# Patient Record
Sex: Female | Born: 1960 | Race: White | Hispanic: No | Marital: Married | Smoking: Current every day smoker
Health system: Southern US, Community
[De-identification: ages and names within clinical notes are randomized; demographics above are authoritative.]

## PROBLEM LIST (undated history)

## (undated) DIAGNOSIS — M1990 Unspecified osteoarthritis, unspecified site: Secondary | ICD-10-CM

## (undated) DIAGNOSIS — L409 Psoriasis, unspecified: Secondary | ICD-10-CM

## (undated) DIAGNOSIS — K219 Gastro-esophageal reflux disease without esophagitis: Secondary | ICD-10-CM

## (undated) DIAGNOSIS — F329 Major depressive disorder, single episode, unspecified: Secondary | ICD-10-CM

## (undated) DIAGNOSIS — F319 Bipolar disorder, unspecified: Secondary | ICD-10-CM

## (undated) DIAGNOSIS — E785 Hyperlipidemia, unspecified: Secondary | ICD-10-CM

## (undated) DIAGNOSIS — G4730 Sleep apnea, unspecified: Secondary | ICD-10-CM

## (undated) DIAGNOSIS — F4310 Post-traumatic stress disorder, unspecified: Secondary | ICD-10-CM

## (undated) DIAGNOSIS — F1910 Other psychoactive substance abuse, uncomplicated: Secondary | ICD-10-CM

## (undated) DIAGNOSIS — F32A Depression, unspecified: Secondary | ICD-10-CM

## (undated) DIAGNOSIS — I10 Essential (primary) hypertension: Secondary | ICD-10-CM

## (undated) HISTORY — PX: AUGMENTATION MAMMAPLASTY: SUR837

## (undated) HISTORY — PX: BLADDER REPAIR: SHX76

## (undated) HISTORY — PX: RECTAL PROLAPSE REPAIR: SHX759

## (undated) HISTORY — PX: TONSILLECTOMY: SUR1361

## (undated) HISTORY — DX: Other psychoactive substance abuse, uncomplicated: F19.10

## (undated) HISTORY — DX: Sleep apnea, unspecified: G47.30

## (undated) HISTORY — PX: BLADDER SURGERY: SHX569

## (undated) HISTORY — DX: Hyperlipidemia, unspecified: E78.5

## (undated) HISTORY — DX: Post-traumatic stress disorder, unspecified: F43.10

## (undated) HISTORY — PX: ABDOMINAL HYSTERECTOMY: SHX81

## (undated) HISTORY — PX: BREAST ENHANCEMENT SURGERY: SHX7

## (undated) HISTORY — DX: Unspecified osteoarthritis, unspecified site: M19.90

## (undated) HISTORY — PX: REPAIR OF RECTAL PROLAPSE: SHX6420

---

## 1998-02-06 ENCOUNTER — Ambulatory Visit (HOSPITAL_COMMUNITY): Admission: RE | Admit: 1998-02-06 | Discharge: 1998-02-06 | Payer: Self-pay | Admitting: Family Medicine

## 1998-02-06 ENCOUNTER — Encounter: Payer: Self-pay | Admitting: Family Medicine

## 1999-02-12 ENCOUNTER — Encounter: Payer: Self-pay | Admitting: Family Medicine

## 1999-02-12 ENCOUNTER — Ambulatory Visit (HOSPITAL_COMMUNITY): Admission: RE | Admit: 1999-02-12 | Discharge: 1999-02-12 | Payer: Self-pay | Admitting: Family Medicine

## 1999-02-15 ENCOUNTER — Ambulatory Visit (HOSPITAL_COMMUNITY): Admission: RE | Admit: 1999-02-15 | Discharge: 1999-02-15 | Payer: Self-pay | Admitting: Family Medicine

## 1999-02-15 ENCOUNTER — Encounter: Payer: Self-pay | Admitting: Family Medicine

## 1999-03-26 ENCOUNTER — Other Ambulatory Visit: Admission: RE | Admit: 1999-03-26 | Discharge: 1999-03-26 | Payer: Self-pay | Admitting: Family Medicine

## 1999-04-09 ENCOUNTER — Ambulatory Visit (HOSPITAL_COMMUNITY): Admission: RE | Admit: 1999-04-09 | Discharge: 1999-04-09 | Payer: Self-pay | Admitting: Obstetrics and Gynecology

## 1999-04-09 ENCOUNTER — Encounter: Payer: Self-pay | Admitting: Obstetrics and Gynecology

## 1999-04-27 ENCOUNTER — Encounter (INDEPENDENT_AMBULATORY_CARE_PROVIDER_SITE_OTHER): Payer: Self-pay | Admitting: Specialist

## 1999-04-27 ENCOUNTER — Inpatient Hospital Stay (HOSPITAL_COMMUNITY): Admission: RE | Admit: 1999-04-27 | Discharge: 1999-05-01 | Payer: Self-pay | Admitting: Family Medicine

## 2001-01-10 ENCOUNTER — Emergency Department (HOSPITAL_COMMUNITY): Admission: EM | Admit: 2001-01-10 | Discharge: 2001-01-10 | Payer: Self-pay | Admitting: Emergency Medicine

## 2001-01-10 ENCOUNTER — Encounter: Payer: Self-pay | Admitting: Emergency Medicine

## 2001-05-10 ENCOUNTER — Encounter: Payer: Self-pay | Admitting: Obstetrics and Gynecology

## 2001-05-10 ENCOUNTER — Ambulatory Visit (HOSPITAL_COMMUNITY): Admission: RE | Admit: 2001-05-10 | Discharge: 2001-05-10 | Payer: Self-pay | Admitting: Obstetrics and Gynecology

## 2001-05-15 ENCOUNTER — Encounter: Admission: RE | Admit: 2001-05-15 | Discharge: 2001-05-15 | Payer: Self-pay | Admitting: Obstetrics and Gynecology

## 2001-05-15 ENCOUNTER — Encounter: Payer: Self-pay | Admitting: Obstetrics and Gynecology

## 2001-11-29 ENCOUNTER — Encounter: Payer: Self-pay | Admitting: Obstetrics and Gynecology

## 2001-11-29 ENCOUNTER — Ambulatory Visit (HOSPITAL_COMMUNITY): Admission: RE | Admit: 2001-11-29 | Discharge: 2001-11-29 | Payer: Self-pay | Admitting: Obstetrics and Gynecology

## 2002-11-07 ENCOUNTER — Encounter: Payer: Self-pay | Admitting: Obstetrics and Gynecology

## 2002-11-07 ENCOUNTER — Encounter: Admission: RE | Admit: 2002-11-07 | Discharge: 2002-11-07 | Payer: Self-pay | Admitting: Obstetrics and Gynecology

## 2003-03-05 ENCOUNTER — Other Ambulatory Visit: Admission: RE | Admit: 2003-03-05 | Discharge: 2003-03-05 | Payer: Self-pay | Admitting: Obstetrics and Gynecology

## 2003-04-27 ENCOUNTER — Emergency Department (HOSPITAL_COMMUNITY): Admission: EM | Admit: 2003-04-27 | Discharge: 2003-04-27 | Payer: Self-pay | Admitting: Emergency Medicine

## 2003-12-05 ENCOUNTER — Ambulatory Visit (HOSPITAL_COMMUNITY): Admission: RE | Admit: 2003-12-05 | Discharge: 2003-12-05 | Payer: Self-pay | Admitting: Obstetrics and Gynecology

## 2004-03-14 ENCOUNTER — Emergency Department (HOSPITAL_COMMUNITY): Admission: EM | Admit: 2004-03-14 | Discharge: 2004-03-14 | Payer: Self-pay | Admitting: Family Medicine

## 2004-10-10 ENCOUNTER — Emergency Department (HOSPITAL_COMMUNITY): Admission: EM | Admit: 2004-10-10 | Discharge: 2004-10-10 | Payer: Self-pay | Admitting: Emergency Medicine

## 2005-06-21 ENCOUNTER — Other Ambulatory Visit: Admission: RE | Admit: 2005-06-21 | Discharge: 2005-06-21 | Payer: Self-pay | Admitting: Obstetrics and Gynecology

## 2005-07-18 ENCOUNTER — Ambulatory Visit (HOSPITAL_COMMUNITY): Admission: RE | Admit: 2005-07-18 | Discharge: 2005-07-18 | Payer: Self-pay | Admitting: Obstetrics and Gynecology

## 2006-06-02 ENCOUNTER — Emergency Department (HOSPITAL_COMMUNITY): Admission: EM | Admit: 2006-06-02 | Discharge: 2006-06-02 | Payer: Self-pay | Admitting: Emergency Medicine

## 2006-06-07 ENCOUNTER — Encounter: Admission: RE | Admit: 2006-06-07 | Discharge: 2006-06-07 | Payer: Self-pay | Admitting: Family Medicine

## 2007-05-11 ENCOUNTER — Encounter: Admission: RE | Admit: 2007-05-11 | Discharge: 2007-05-11 | Payer: Self-pay | Admitting: Family Medicine

## 2008-03-18 ENCOUNTER — Emergency Department (HOSPITAL_COMMUNITY): Admission: EM | Admit: 2008-03-18 | Discharge: 2008-03-18 | Payer: Self-pay | Admitting: Emergency Medicine

## 2009-11-14 ENCOUNTER — Emergency Department (HOSPITAL_COMMUNITY): Admission: EM | Admit: 2009-11-14 | Discharge: 2009-11-14 | Payer: Self-pay | Admitting: Emergency Medicine

## 2010-05-27 LAB — URINALYSIS, ROUTINE W REFLEX MICROSCOPIC
Bilirubin Urine: NEGATIVE
Glucose, UA: NEGATIVE mg/dL
Ketones, ur: NEGATIVE mg/dL
Leukocytes, UA: NEGATIVE
Nitrite: NEGATIVE
Protein, ur: NEGATIVE mg/dL
Specific Gravity, Urine: 1.015 (ref 1.005–1.030)
Urobilinogen, UA: 0.2 mg/dL (ref 0.0–1.0)
pH: 6.5 (ref 5.0–8.0)

## 2010-05-27 LAB — URINE MICROSCOPIC-ADD ON

## 2010-06-28 LAB — COMPREHENSIVE METABOLIC PANEL
ALT: 12 U/L (ref 0–35)
Albumin: 3.6 g/dL (ref 3.5–5.2)
Alkaline Phosphatase: 75 U/L (ref 39–117)
GFR calc Af Amer: >60 mL/min (ref >60)
Potassium: 4.4 mEq/L (ref 3.5–5.1)
Sodium: 138 mEq/L (ref 135–145)
Total Bilirubin: 1.1 mg/dL (ref 0.3–1.2)
Total Protein: 6.7 g/dL (ref 6.0–8.3)

## 2010-06-28 LAB — URINE MICROSCOPIC-ADD ON

## 2010-06-28 LAB — CBC
HCT: 40.9 % (ref 36.0–46.0)
MCHC: 33.6 g/dL (ref 30.0–36.0)
RDW: 13.3 % (ref 11.5–15.5)
WBC: 8.1 10*3/uL (ref 4.0–10.5)

## 2010-06-28 LAB — URINALYSIS, ROUTINE W REFLEX MICROSCOPIC: Nitrite: NEGATIVE

## 2010-06-28 LAB — DIFFERENTIAL
Lymphocytes Relative: 17 % (ref 12–46)
Lymphs Abs: 1.4 10*3/uL (ref 0.7–4.0)
Monocytes Absolute: 0.6 10*3/uL (ref 0.1–1.0)
Monocytes Relative: 7 % (ref 3–12)
Neutro Abs: 6 10*3/uL (ref 1.7–7.7)
Neutrophils Relative %: 74 % (ref 43–77)

## 2010-07-30 NOTE — Discharge Summary (Signed)
[facility]  Patient:    Rebecca Espinoza, Rebecca Espinoza                       MRN: [id number] Adm. Date:  [id number] Disc. Date: [id number] Attending:  Miguel Aschoff                           Discharge Summary  ADMISSION DIAGNOSES:          1. Cystocele.                               2. Rectocele.                               3. Uterine descensus.                               4. Urinary stress incontinence.  FINAL DIAGNOSES:              1. Cystocele.                               2. Rectocele.                               3. Uterine descensus.                               4. Urinary stress incontinence.  OPERATIONS AND PROCEDURES:    1. Total vaginal hysterectomy.                               2. Bilateral salpingo-oophorectomy.                               3. Anterior and posterior colporrhaphy.  HISTORY OF PRESENT ILLNESS:   The patient is a 50 year old white female who has a history of pelvic pressure and pain and on examination was noted to have marked  relaxation of the pelvic floor with a cystourethrocele, large rectocele, and uterine descensus.  In addition, she had loss of the posterior ureterovesical angle and this was associated with urinary stress incontinence.  Because of these symptoms and a request for definitive repair, the patient was brought to the operating room to undergo total vaginal hysterectomy and anterior and posterior  colporrhaphy.  In addition, the patient had requested bilateral salpingo-oophorectomy be carried out at the time of this procedure.  LABORATORY DATA:              Preoperative studies were obtained.  This revealed an admission hemoglobin of 13.3 and a white count of 5100.  PT and PTT within normal limits.  Routine chemistry profile was also within normal limits.  The urinalysis was negative.  HOSPITAL COURSE:              Under general anesthesia, on April 27, 1999, the patient underwent a total vaginal  hysterectomy, bilateral salpingo-oophorectomy, and anterior and posterior colporrhaphy.  This was carried out without difficulty. At the time of  surgery, the posterior urethrovesical angle was reestablished. he patients postoperative course was essentially uncomplicated, although she initially had nausea and could not tolerate a regular diet.  However, by the fourth postoperative day, the patient was ambulating well and tolerating a regular diet. She, however, was unable to void spontaneously.  It was felt that she was in satisfactory condition at this point and she was discharged home on May 01, 1999.  DISCHARGE MEDICATIONS:        1. Tylox one every three hours as needed for pain.                               2. Premarin 0.625 mg one daily.                               3. Doxepin one twice a day.                               4. Macrobid one daily.  DIET:                         She was sent home on a regular diet.  ACTIVITIES:                   She was instructed in no heavy lifting and to place nothing in the vagina.  FOLLOW-UP:                    To call if there were any problems with fever, pain, or heavy bleeding.  She was also instructed to return to the office on May 06, 1999, after removing her Foley catheter to ensure that she was able to void spontaneously. DD:  05/21/99 TD:  05/22/99 Job: [id number]

## 2010-07-30 NOTE — Op Note (Signed)
[facility]  Patient:    Rebecca Espinoza, Rebecca Espinoza                       MRN: [id number] Proc. Date: 04/27/99 Adm. Date:  [id number] Attending:  Miguel Aschoff                           Operative Report  PREOPERATIVE DIAGNOSES:       Urinary stress incontinence, cystourethrocele, rectocele, uterine descensus.  POSTOPERATIVE DIAGNOSES:      Urinary stress incontinence, cystourethrocele, rectocele, uterine descensus.  PROCEDURE:                    Total vaginal hysterectomy, bilateral salpingo-oophorectomy, anterior and posterior colporrhaphy.  SURGEON:                      Miguel Aschoff, M.D.  ASSISTANT:                    Luvenia Redden, M.D.  ANESTHESIA:                   General.  COMPLICATIONS:                None.  JUSTIFICATION:                The patient is a 50 year old white female with history of urinary stress incontinence and pelvic pressure and pain.  On examination, she has an obvious cystourethrocele as well as uterine descensus and a small rectocele.  Because of the symptomatology associated with these pelvic floor defects, she presents now to undergo total vaginal hysterectomy and anterior and posterior colporrhaphy as well as urethral elevation.  In addition, the patient has requested that her ovaries be removed at this procedure if possible.  Informed consent has been obtained.  DESCRIPTION OF PROCEDURE:     Patient was taken to the operating room, placed in the supine position and general anesthesia was then administered without difficulty.  She was then placed in the dorsal lithotomy position and prepped and draped in the usual sterile fashion.  Bladder was then catheterized.  A weighted speculum was placed in the vaginal vault.  The anterior cervical lip was then grasped with a tenaculum and injected with 1% Xylocaine with epinephrine. After this was done, the mucosa was incised circumferentially and dissected anteriorly and  posteriorly until the peritoneal reflections could be found.  Peritoneum was then entered posteriorly.  Uterosacral ligaments were then identified, clamped, cut and suture-ligated using suture ligatures of 0 Vicryl.  The cardinal ligaments ere clamped, cut and suture-ligated in a similar fashion.  Similar bites were then taken of the paracervical fascia and these were clamped, cut and suture-ligated  using Heaney clamps and suture ligatures of 0 Vicryl.  At this point, the anterior peritoneum was entered and the dissection continued with the uterine vessels being identified, clamped, cut and suture-ligated using suture ligatures of 0 Vicryl.  Additional bites were taken of the broad ligament using curved Heaney clamps. ll pedicles were suture-ligated using suture ligatures of 0 Vicryl.  The uterine fundus was then delivered through the cul-de-sac and the remaining structures of the broad ligament, round ligament and uteroovarian ligament were then clamped ith Heaney clamps.  All pedicles were cut and suture-ligated using suture ligatures of 0 Vicryl.  At this point, the ovaries and tubes were identified.  On  the left side, they were grasped with a Babcock clamp, then a Heaney clamp was placed across the infundibulopelvic ligament, then the specimen of the ovary and tube on the left  side was excised and then a suture ligature was used about the infundibulopelvic ligament, followed by a free tie of 0 Vicryl.  An identical procedure was then carried out on the right side with the tube and ovary being grasped, the infundibulopelvic ligament being cut and doubly ligated using first a suture ligature and then a free tie of 0 Vicryl.  Inspection was made of all pedicles;  there was excellent hemostasis noted.  There appeared to be a small defect on the cul-de-sac and in an effort to prevent an enterocele, a 0 Vicryl suture was used to close the redundant cul-de-sac and a  pursestring suture was placed about the peritoneum and the peritoneum was closed; this was done with all lap counts and  instrument counts being correct.  A Foley catheter was then inserted.  The anterior vaginal wall was then injected again with 1% Xylocaine with epinephrine and dissection of the anterior wall mucosa was carried out to approximately 2 cm of the urethral orifice, then the paravesical fascia was dissected free from the overlying vaginal mucosa.  The urethrovesical angle was then identified and using a series of 2-0 Vicryl sutures, the urethrovesical angle elevated and then this was reinforced with a 0 Vicryl suture.  The cystocele was then reduced using pursestring suture of 2-0 Vicryl suture and then doubly reinforced with multiple 2-0 Vicryl sutures. The excess vaginal mucosa was trimmed and then the mucosa and dead space were closed using one interlocking 2-0 Vicryl suture.  The uterosacral ligaments were then approximated in the midline using interrupted 0 Vicryl sutures.  After this was  done, attention was directed to the posterior vaginal wall.  An ellipse of tissue of the perineum was then incised and elevated and the vaginal mucosa was grasped and this area was injected with 1% Xylocaine with epinephrine, then the mucosa as separated from the underlying pararectal fascia.  This dissection continued into the vagina for approximately 4 cm.  The rectocele was then closed using a pursestring suture of 2-0 Vicryl suture and then several interrupted sutures of 2-0 Vicryl.  The perineal muscles and levator muscles were approximated in the midline using interrupted 2-0 Vicryl sutures.  The excess mucosa was trimmed and then the vaginal mucosa was closed using one interlocking 2-0 Vicryl suture.  Perineum was closed using first a subcutaneous suture of 2-0 Vicryl suture, followed by a subcuticular suture of 2-0 Vicryl.  Final inspection was made for  hemostasis. There appeared to both good anterior and posterior support.  A vaginal pack of Iodoform gauze was placed and the procedure was completed.  The estimated blood  loss was approximately 200 cc.  Patient tolerated the procedure well. DD:  04/27/99 TD:  04/28/99 Job: [id number]

## 2010-09-22 DIAGNOSIS — Z113 Encounter for screening for infections with a predominantly sexual mode of transmission: Secondary | ICD-10-CM | POA: Insufficient documentation

## 2010-09-22 DIAGNOSIS — Z0000 Encounter for general adult medical examination without abnormal findings: Secondary | ICD-10-CM | POA: Insufficient documentation

## 2010-09-24 ENCOUNTER — Other Ambulatory Visit: Payer: Self-pay | Admitting: Family Medicine

## 2010-09-24 DIAGNOSIS — E049 Nontoxic goiter, unspecified: Secondary | ICD-10-CM

## 2010-09-27 ENCOUNTER — Ambulatory Visit
Admission: RE | Admit: 2010-09-27 | Discharge: 2010-09-27 | Disposition: A | Discharge status: Home / Self Care | Payer: BC Managed Care – PPO | Source: Ambulatory Visit | Attending: Family Medicine | Admitting: Family Medicine

## 2010-09-27 DIAGNOSIS — E049 Nontoxic goiter, unspecified: Secondary | ICD-10-CM

## 2010-10-07 ENCOUNTER — Other Ambulatory Visit (HOSPITAL_COMMUNITY)
Admission: RE | Admit: 2010-10-07 | Discharge: 2010-10-07 | Disposition: A | Discharge status: Home / Self Care | Payer: BC Managed Care – PPO | Source: Ambulatory Visit | Attending: Family Medicine | Admitting: Family Medicine

## 2010-12-25 ENCOUNTER — Emergency Department (HOSPITAL_COMMUNITY): Payer: BC Managed Care – PPO

## 2010-12-25 ENCOUNTER — Emergency Department (HOSPITAL_COMMUNITY)
Admission: EM | Admit: 2010-12-25 | Discharge: 2010-12-26 | Disposition: A | Discharge status: Home / Self Care | Payer: BC Managed Care – PPO | Attending: Emergency Medicine | Admitting: Emergency Medicine

## 2010-12-25 DIAGNOSIS — L4050 Arthropathic psoriasis, unspecified: Secondary | ICD-10-CM | POA: Insufficient documentation

## 2010-12-25 DIAGNOSIS — F3289 Other specified depressive episodes: Secondary | ICD-10-CM | POA: Insufficient documentation

## 2010-12-25 DIAGNOSIS — R1011 Right upper quadrant pain: Secondary | ICD-10-CM | POA: Insufficient documentation

## 2010-12-25 DIAGNOSIS — F329 Major depressive disorder, single episode, unspecified: Secondary | ICD-10-CM | POA: Insufficient documentation

## 2010-12-25 DIAGNOSIS — Z87442 Personal history of urinary calculi: Secondary | ICD-10-CM | POA: Insufficient documentation

## 2010-12-25 DIAGNOSIS — Z79899 Other long term (current) drug therapy: Secondary | ICD-10-CM | POA: Insufficient documentation

## 2010-12-25 LAB — DIFFERENTIAL
Basophils Absolute: 0 10*3/uL (ref 0.0–0.1)
Eosinophils Absolute: 0.1 10*3/uL (ref 0.0–0.7)
Eosinophils Relative: 1 % (ref 0–5)
Lymphocytes Relative: 39 % (ref 12–46)
Lymphs Abs: 2.1 10*3/uL (ref 0.7–4.0)
Monocytes Absolute: 0.6 10*3/uL (ref 0.1–1.0)
Monocytes Relative: 11 % (ref 3–12)
Neutro Abs: 2.7 10*3/uL (ref 1.7–7.7)
Neutrophils Relative %: 49 % (ref 43–77)

## 2010-12-25 LAB — CBC
HCT: 38.8 % (ref 36.0–46.0)
MCH: 29.7 pg (ref 26.0–34.0)
MCV: 85.5 fL (ref 78.0–100.0)
RBC: 4.54 MIL/uL (ref 3.87–5.11)
RDW: 12.8 % (ref 11.5–15.5)
WBC: 5.5 10*3/uL (ref 4.0–10.5)

## 2010-12-25 LAB — BASIC METABOLIC PANEL
BUN: 13 mg/dL (ref 6–23)
Calcium: 9.7 mg/dL (ref 8.4–10.5)
GFR calc Af Amer: >90 mL/min (ref >90)
GFR calc non Af Amer: >90 mL/min (ref >90)
Glucose, Bld: 117 mg/dL — ABNORMAL HIGH (ref 70–99)
Sodium: 141 mEq/L (ref 135–145)

## 2010-12-25 LAB — HEPATIC FUNCTION PANEL
Albumin: 4.1 g/dL (ref 3.5–5.2)
Alkaline Phosphatase: 141 U/L — ABNORMAL HIGH (ref 39–117)
Total Protein: 7.4 g/dL (ref 6.0–8.3)

## 2010-12-25 LAB — URINALYSIS, ROUTINE W REFLEX MICROSCOPIC
Bilirubin Urine: NEGATIVE
pH: 7 (ref 5.0–8.0)

## 2010-12-25 LAB — URINE MICROSCOPIC-ADD ON

## 2010-12-25 MED ORDER — IOHEXOL 300 MG/ML  SOLN
100.0000 mL | Freq: Once | INTRAMUSCULAR | Status: AC | PRN
  Administered 2010-12-25: 100 mL via INTRAVENOUS

## 2010-12-27 LAB — URINE CULTURE
Colony Count: NO GROWTH
Culture: NO GROWTH

## 2011-10-01 ENCOUNTER — Emergency Department (HOSPITAL_COMMUNITY)
Admission: EM | Admit: 2011-10-01 | Discharge: 2011-10-01 | Disposition: A | Discharge status: Home / Self Care | Payer: Self-pay | Attending: Emergency Medicine | Admitting: Emergency Medicine

## 2011-10-01 ENCOUNTER — Emergency Department (HOSPITAL_COMMUNITY): Payer: Self-pay

## 2011-10-01 ENCOUNTER — Encounter (HOSPITAL_COMMUNITY): Payer: Self-pay | Admitting: *Deleted

## 2011-10-01 DIAGNOSIS — K219 Gastro-esophageal reflux disease without esophagitis: Secondary | ICD-10-CM | POA: Insufficient documentation

## 2011-10-01 DIAGNOSIS — S63509A Unspecified sprain of unspecified wrist, initial encounter: Secondary | ICD-10-CM | POA: Insufficient documentation

## 2011-10-01 DIAGNOSIS — W07XXXA Fall from chair, initial encounter: Secondary | ICD-10-CM | POA: Insufficient documentation

## 2011-10-01 HISTORY — DX: Major depressive disorder, single episode, unspecified: F32.9

## 2011-10-01 HISTORY — DX: Gastro-esophageal reflux disease without esophagitis: K21.9

## 2011-10-01 HISTORY — DX: Depression, unspecified: F32.A

## 2011-10-01 MED ORDER — NAPROXEN 500 MG PO TABS: 500.0000 mg | ORAL_TABLET | Freq: Two times a day (BID) | ORAL | Status: DC

## 2011-10-01 MED ORDER — HYDROCODONE-ACETAMINOPHEN 5-325 MG PO TABS: 1.0000 | ORAL_TABLET | Freq: Four times a day (QID) | ORAL | Status: AC | PRN

## 2011-10-01 NOTE — Progress Notes (Signed)
Orthopedic Tech Progress Note Patient Details:  Rebecca Espinoza 06-Feb-1961 [id number]  Ortho Devices Type of Ortho Device: Velcro wrist splint Ortho Device/Splint Location: right wrist Ortho Device/Splint Interventions: Application   Nikki Dom 10/01/2011, 6:52 PM

## 2011-10-01 NOTE — ED Provider Notes (Signed)
History  Scribed for Shelda Jakes, MD, the patient was seen in room TR11C/TR11C. This chart was scribed by Candelaria Stagers. The patient's care started at 4:22 PM   CSN: [id number]  Arrival date & time 10/01/11  1533   First MD Initiated Contact with Patient 10/01/11 1614      Chief Complaint  Patient presents with  . Wrist Pain     The history is provided by the patient.   Rebecca Espinoza is a 51 y.o. female who presents to the Emergency Department complaining of right wrist pain after falling off a stool earlier today landing on her wrist.  She denies any other injury, LOC.  Nothing seems to make the pain better or worse.       Past Medical History  Diagnosis Date  . Acid reflux   . Depression     History reviewed. No pertinent past surgical history.  History reviewed. No pertinent family history.  History  Substance Use Topics  . Smoking status: Not on file  . Smokeless tobacco: Not on file  . Alcohol Use: No    OB History    Grav Para Term Preterm Abortions TAB SAB Ect Mult Living                  Review of Systems  HENT: Negative for neck pain.   Respiratory: Negative for shortness of breath.   Cardiovascular: Negative for chest pain.  Gastrointestinal: Negative for abdominal pain.  Musculoskeletal: Negative for back pain.  Skin: Negative for wound.  Neurological: Negative for numbness.  All other systems reviewed and are negative.    Allergies  Review of patient's allergies indicates no known allergies.  Home Medications   Current Outpatient Rx  Name Route Sig Dispense Refill  . HYDROCODONE-ACETAMINOPHEN 5-325 MG PO TABS Oral Take 1-2 tablets by mouth every 6 (six) hours as needed for pain. 10 tablet 0  . NAPROXEN 500 MG PO TABS Oral Take 1 tablet (500 mg total) by mouth 2 (two) times daily. 14 tablet 0    BP 143/90  Pulse 88  Resp 20  SpO2 100%  Physical Exam  Nursing note and vitals reviewed. Constitutional: She is oriented to  person, place, and time. She appears well-developed and well-nourished. No distress.  HENT:  Head: Normocephalic and atraumatic.  Eyes: EOM are normal. No scleral icterus.  Cardiovascular: Normal rate and regular rhythm.   No murmur heard. Pulmonary/Chest: She has no wheezes. She has no rales.  Abdominal: Bowel sounds are normal. There is no tenderness.  Musculoskeletal:       Radial pulse 1+.  Cap refill 1+.  Good movement of the right fingers.  Normal sensation of the right hand.    Neurological: She is alert and oriented to person, place, and time. No cranial nerve deficit.  Skin: Skin is warm and dry. She is not diaphoretic.  Psychiatric: She has a normal mood and affect. Her behavior is normal.    ED Course  Procedures   DIAGNOSTIC STUDIES: Oxygen Saturation is 100% on room air, normal by my interpretation.    COORDINATION OF CARE:     Labs Reviewed - No data to display Dg Wrist Complete Right  10/01/2011  *RADIOLOGY REPORT*  Clinical Data: Fall, pain  RIGHT WRIST - COMPLETE 3+ VIEW  Comparison: None.  Findings: Normal alignment without fracture.  Distal radius, ulna and carpal bones intact.  No radiographic swelling.  IMPRESSION: No acute osseous finding  Original Report Authenticated  By: M. Ruel Favors, M.D.     1. Wrist sprain       MDM   Fall with right wrist injury some snuffbox tenderness concern for an occult navicular injury or fracture. We'll treat with a Velcro wrist splint and followup with hand surgery.   I personally performed the services described in this documentation, which was scribed in my presence. The recorded information has been reviewed and considered.          Shelda Jakes, MD 10/01/11 1640

## 2011-10-01 NOTE — ED Notes (Signed)
Reports falling off a stool and trying to break her fall, now has right wrist pain, mild swelling noted, cms intact.

## 2011-11-17 ENCOUNTER — Other Ambulatory Visit: Payer: Self-pay | Admitting: Family Medicine

## 2011-11-17 ENCOUNTER — Ambulatory Visit
Admission: RE | Admit: 2011-11-17 | Discharge: 2011-11-17 | Disposition: A | Discharge status: Home / Self Care | Payer: No Typology Code available for payment source | Source: Ambulatory Visit | Attending: Family Medicine | Admitting: Family Medicine

## 2011-11-17 DIAGNOSIS — L409 Psoriasis, unspecified: Secondary | ICD-10-CM

## 2011-11-25 ENCOUNTER — Encounter (HOSPITAL_COMMUNITY): Payer: Self-pay | Admitting: Emergency Medicine

## 2011-11-25 ENCOUNTER — Emergency Department (INDEPENDENT_AMBULATORY_CARE_PROVIDER_SITE_OTHER)
Admission: EM | Admit: 2011-11-25 | Discharge: 2011-11-25 | Disposition: A | Discharge status: Home / Self Care | Payer: Self-pay | Source: Home / Self Care | Attending: Family Medicine | Admitting: Family Medicine

## 2011-11-25 DIAGNOSIS — I10 Essential (primary) hypertension: Secondary | ICD-10-CM

## 2011-11-25 HISTORY — DX: Psoriasis, unspecified: L40.9

## 2011-11-25 MED ORDER — LISINOPRIL-HYDROCHLOROTHIAZIDE 10-12.5 MG PO TABS: 1.0000 | ORAL_TABLET | Freq: Every day | ORAL | Status: DC

## 2011-11-25 NOTE — ED Notes (Signed)
Pt c/o elevated blood pressure since Monday of this week... She is on a "clinical trial" for psoriasis and was told on Monday that her BP was high... She's been checking it at her local [facility] and it's been running high.. No hx of elev. BP.... C/o headaches, chest discomfort, and feeling fatigued.... Denies: blurry vision, edema.

## 2011-11-25 NOTE — ED Provider Notes (Signed)
History     CSN: [id number]  Arrival date & time 11/25/11  4098   First MD Initiated Contact with Patient 11/25/11 1013      Chief Complaint  Patient presents with  . Hypertension    (Consider location/radiation/quality/duration/timing/severity/associated sxs/prior treatment) Patient is a 51 y.o. female presenting with hypertension. The history is provided by the patient.  Hypertension This is a chronic problem. The current episode started more than 1 week ago (told off and on that bp may have been elevated, is in financial difficulty, no md because could not pay bill, husband died 3 yr ago.). The problem has not changed since onset.Associated symptoms include headaches. Pertinent negatives include no shortness of breath.    Past Medical History  Diagnosis Date  . Acid reflux   . Depression   . Psoriasis   . Hypertension     Past Surgical History  Procedure Date  . Abdominal hysterectomy   . Rectal prolapse repair     No family history on file.  History  Substance Use Topics  . Smoking status: Not on file  . Smokeless tobacco: Not on file  . Alcohol Use: No    OB History    Grav Para Term Preterm Abortions TAB SAB Ect Mult Living                  Review of Systems  Constitutional: Negative.   Respiratory: Negative for chest tightness and shortness of breath.   Neurological: Positive for headaches. Negative for dizziness and numbness.    Allergies  Review of patient's allergies indicates no known allergies.  Home Medications   Current Outpatient Rx  Name Route Sig Dispense Refill  . INVESTIGATIONAL DRUG MIXTURE RECORD Subcutaneous Inject into the skin every 7 (seven) days. 1 prefilled syringe every Monday took first dose 11-21-11 Lxekizumab know as [id number] Protocol # I1F-MC-RHBL  Wirm [id number] By [employer] 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 LISINOPRIL-HYDROCHLOROTHIAZIDE 10-12.5 MG PO TABS Oral Take 1 tablet by mouth daily.      BP  176/109  Pulse 70  Temp 98.5 F (36.9 C) (Oral)  Resp 18  SpO2 99%  Physical Exam  Nursing note and vitals reviewed. Constitutional: She is oriented to person, place, and time. She appears well-developed and well-nourished.  HENT:  Head: Normocephalic.  Mouth/Throat: Oropharynx is clear and moist.  Eyes: Conjunctivae normal are normal. Pupils are equal, round, and reactive to light.  Neck: Normal range of motion. Neck supple.  Cardiovascular: Normal rate, normal heart sounds and intact distal pulses.   Pulmonary/Chest: Breath sounds normal.  Lymphadenopathy:    She has no cervical adenopathy.  Neurological: She is alert and oriented to person, place, and time.  Skin: Skin is warm and dry.    ED Course  Procedures (including critical care time)  Labs Reviewed - No data to display No results found.   1. Hypertension       MDM  Discussed with dr Scot Jun segren at [facility] re psoriasis study drug---no bp effect., they will f/up on mon with pt.        [name] Hoff, MD 11/26/11 [phone]

## 2011-11-26 ENCOUNTER — Encounter (HOSPITAL_COMMUNITY): Payer: Self-pay | Admitting: Family Medicine

## 2011-11-26 ENCOUNTER — Emergency Department (HOSPITAL_COMMUNITY)
Admission: EM | Admit: 2011-11-26 | Discharge: 2011-11-26 | Disposition: A | Discharge status: Home / Self Care | Payer: Self-pay | Attending: Emergency Medicine | Admitting: Emergency Medicine

## 2011-11-26 DIAGNOSIS — F329 Major depressive disorder, single episode, unspecified: Secondary | ICD-10-CM | POA: Insufficient documentation

## 2011-11-26 DIAGNOSIS — F3289 Other specified depressive episodes: Secondary | ICD-10-CM | POA: Insufficient documentation

## 2011-11-26 DIAGNOSIS — K219 Gastro-esophageal reflux disease without esophagitis: Secondary | ICD-10-CM | POA: Insufficient documentation

## 2011-11-26 DIAGNOSIS — F411 Generalized anxiety disorder: Secondary | ICD-10-CM | POA: Insufficient documentation

## 2011-11-26 DIAGNOSIS — L408 Other psoriasis: Secondary | ICD-10-CM | POA: Insufficient documentation

## 2011-11-26 DIAGNOSIS — I10 Essential (primary) hypertension: Secondary | ICD-10-CM | POA: Insufficient documentation

## 2011-11-26 HISTORY — DX: Essential (primary) hypertension: I10

## 2011-11-26 NOTE — ED Notes (Signed)
Per pt sts new onset HTN and was prescribed lisinopril. sts this am her BP was 190 sys. Currently BP is 158/97. Denies any symptoms just sts worried about increased BP.

## 2011-11-26 NOTE — ED Provider Notes (Signed)
History  Scribed for Ethelda Chick, MD, the patient was seen in room TR07C/TR07C. This chart was scribed by Candelaria Stagers. The patient's care started at 1:36 PM    CSN: [id number]  Arrival date & time 11/26/11  1102   None     Chief Complaint  Patient presents with  . Hypertension     The history is provided by the patient. No language interpreter was used.   Rebecca Espinoza is a 51 y.o. female who presents to the Emergency Department complaining of elevated blood pressure stating that today's reading was 190/100.  Pt has new onset of HTN and was prescribed lisinopril which she started today.  She has no other sx and is worried about the increasing BP readings.  She is experiencing anxiety regarding the BP readings.  Pt started a new medication for psoriasis six days ago which is part of an investigational study.  No chest pain, no shortness of breath.  No leg swelling.  There are no other associated systemic symptoms, there are no other alleviating or modifying factors.   Past Medical History  Diagnosis Date  . Acid reflux   . Depression   . Psoriasis   . Hypertension     Past Surgical History  Procedure Date  . Abdominal hysterectomy   . Rectal prolapse repair     History reviewed. No pertinent family history.  History  Substance Use Topics  . Smoking status: Not on file  . Smokeless tobacco: Not on file  . Alcohol Use: No    OB History    Grav Para Term Preterm Abortions TAB SAB Ect Mult Living                  Review of Systems  Constitutional:       Elevated blood pressure  Skin:       psoriasis  Psychiatric/Behavioral: The patient is nervous/anxious (regarding elevating BP).   All other systems reviewed and are negative.    Allergies  Review of patient's allergies indicates no known allergies.  Home Medications   Current Outpatient Rx  Name Route Sig Dispense Refill  . INVESTIGATIONAL DRUG MIXTURE RECORD Subcutaneous Inject into the skin  every 7 (seven) days. 1 prefilled syringe every Monday took first dose 11-21-11 Lxekizumab know as [id number] Protocol # I1F-MC-RHBL  Wirm [id number] By [employer] 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 LISINOPRIL-HYDROCHLOROTHIAZIDE 10-12.5 MG PO TABS Oral Take 1 tablet by mouth daily.      BP 147/89  Pulse 62  Temp 98.3 F (36.8 C) (Oral)  Resp 18  SpO2 100%  Physical Exam  Nursing note and vitals reviewed. Constitutional: She is oriented to person, place, and time. She appears well-developed and well-nourished. No distress.  HENT:  Head: Normocephalic and atraumatic.  Eyes: EOM are normal. Pupils are equal, round, and reactive to light.  Neck: Neck supple. No tracheal deviation present.  Cardiovascular: Normal rate.   Pulmonary/Chest: Effort normal. No respiratory distress.  Musculoskeletal: Normal range of motion. She exhibits no edema.  Neurological: She is alert and oriented to person, place, and time.  Skin: Skin is warm and dry.       Plaques with scale on her bilateral forearms c/o psoriasis.    Psychiatric: She has a normal mood and affect. Her behavior is normal.    ED Course  Procedures   DIAGNOSTIC STUDIES: Oxygen Saturation is 100% on room air, normal by my interpretation.  COORDINATION OF CARE:     Labs Reviewed - No data to display No results found.   1. Hypertension       MDM  Pt presents with c/o concern of hypertension.  She has felt symptoms of anxiety and has been concerned about elevated blood pressure reading.  She started lisinopril and took her first dose this morning.  Her blood pressure is mildly elevated in the ED.  No evidence of end organ damage.  Normal examination with the exception of psoriasis.    I personally performed the services described in this documentation, which was scribed in my presence. The recorded information has been reviewed and considered.         Ethelda Chick, MD 11/27/11 [phone]

## 2011-11-26 NOTE — Discharge Instructions (Signed)
Return to the ED with any concerns including weakness of extremities, chest pain, difficulty breathing, swelling of legs, fainting, decreased level of alertness/lethargy, or any other alarming symptoms

## 2014-07-12 IMAGING — CR DG WRIST COMPLETE 3+V*R*
4 series · 4 of 4 positions shown · non-contrast
Comparison: None.

CLINICAL DATA: Fall, pain

RIGHT WRIST - COMPLETE 3+ VIEW

[x wrist pa right]
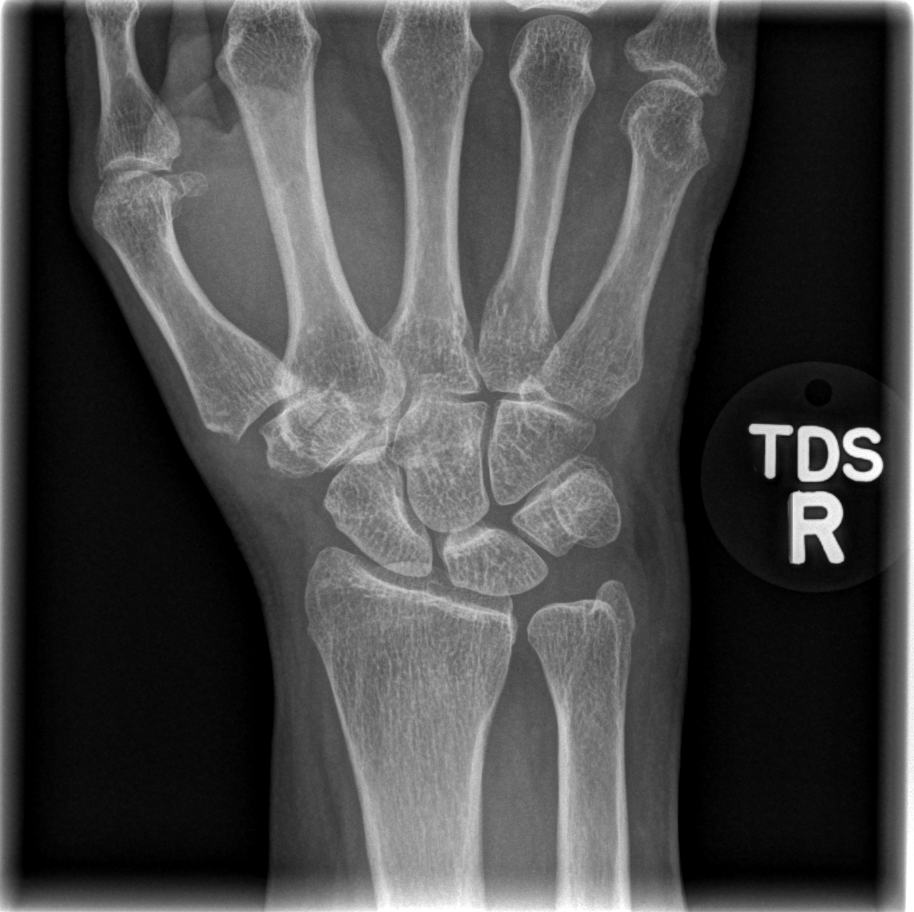

[x wrist obl right]
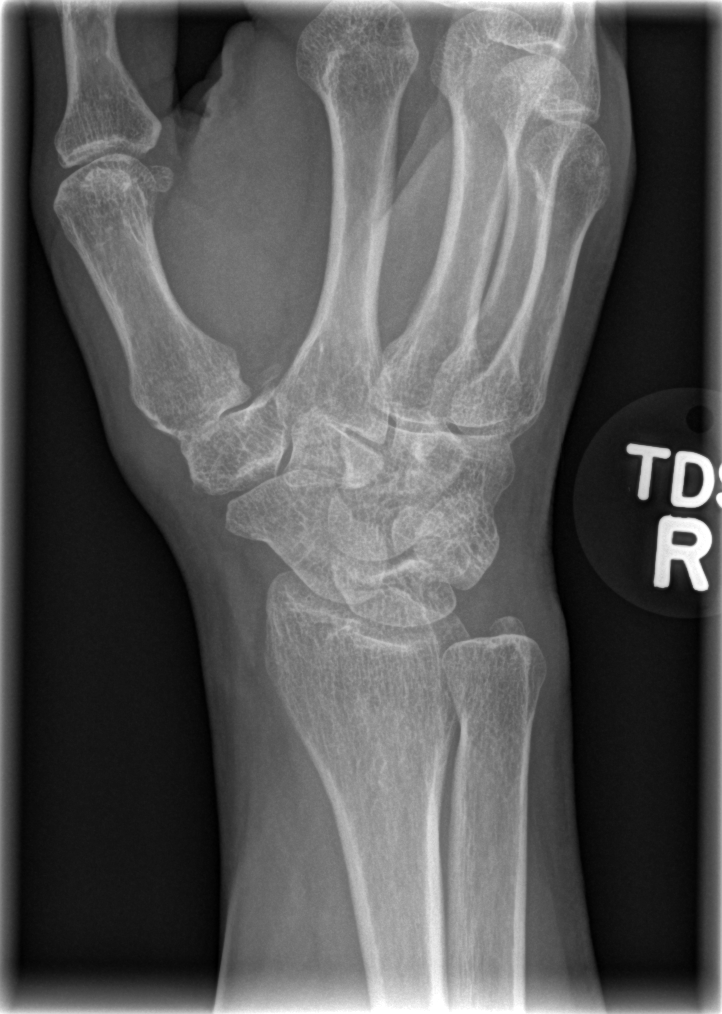

[x wrist lat right]
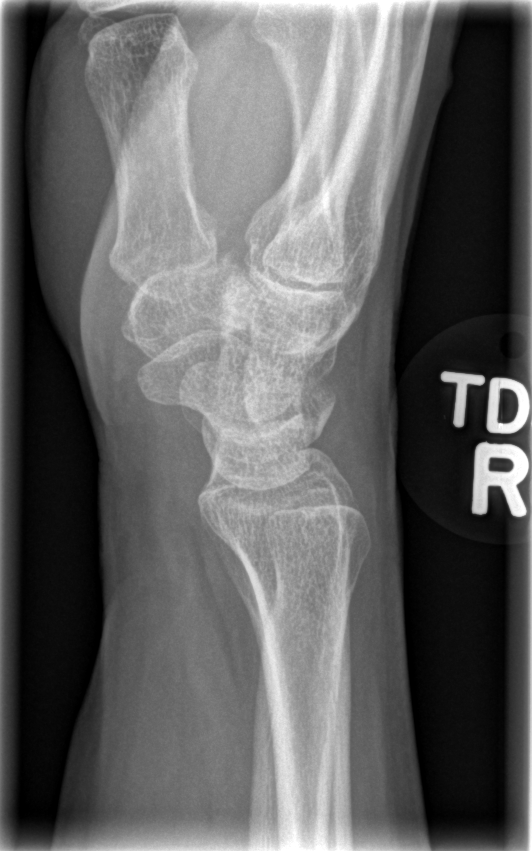

[x navicular]
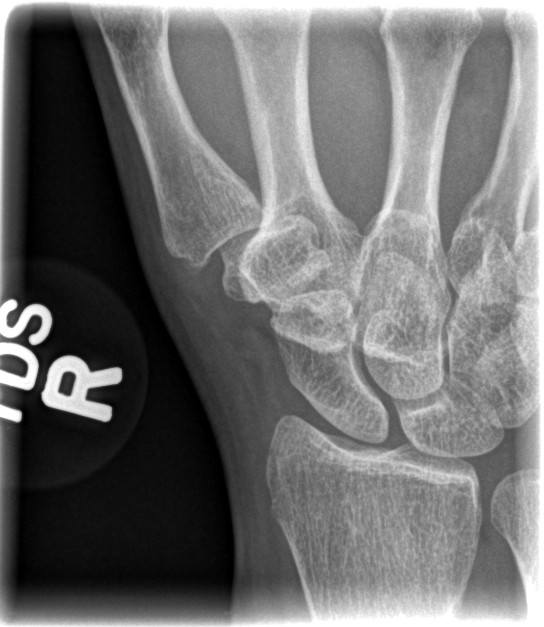

[4 of 4 positions shown; findings below may reference images not displayed]

FINDINGS: Normal alignment without fracture.  Distal radius, ulna
and carpal bones intact.  No radiographic swelling.
IMPRESSION: No acute osseous finding

## 2015-04-05 ENCOUNTER — Encounter (HOSPITAL_COMMUNITY): Payer: Self-pay | Admitting: *Deleted

## 2015-04-05 ENCOUNTER — Emergency Department (HOSPITAL_COMMUNITY): Payer: Self-pay

## 2015-04-05 ENCOUNTER — Emergency Department (HOSPITAL_BASED_OUTPATIENT_CLINIC_OR_DEPARTMENT_OTHER)
Admit: 2015-04-05 | Discharge: 2015-04-05 | Disposition: A | Discharge status: Home / Self Care | Payer: Self-pay | Attending: Emergency Medicine | Admitting: Emergency Medicine

## 2015-04-05 ENCOUNTER — Emergency Department (HOSPITAL_COMMUNITY)
Admission: EM | Admit: 2015-04-05 | Discharge: 2015-04-05 | Disposition: A | Discharge status: Home / Self Care | Payer: Medicaid Other | Attending: Emergency Medicine | Admitting: Emergency Medicine

## 2015-04-05 DIAGNOSIS — Z872 Personal history of diseases of the skin and subcutaneous tissue: Secondary | ICD-10-CM | POA: Insufficient documentation

## 2015-04-05 DIAGNOSIS — F17200 Nicotine dependence, unspecified, uncomplicated: Secondary | ICD-10-CM | POA: Insufficient documentation

## 2015-04-05 DIAGNOSIS — M79609 Pain in unspecified limb: Secondary | ICD-10-CM

## 2015-04-05 DIAGNOSIS — Z8719 Personal history of other diseases of the digestive system: Secondary | ICD-10-CM | POA: Insufficient documentation

## 2015-04-05 DIAGNOSIS — M7121 Synovial cyst of popliteal space [Baker], right knee: Secondary | ICD-10-CM | POA: Insufficient documentation

## 2015-04-05 DIAGNOSIS — M7989 Other specified soft tissue disorders: Secondary | ICD-10-CM

## 2015-04-05 DIAGNOSIS — I10 Essential (primary) hypertension: Secondary | ICD-10-CM | POA: Insufficient documentation

## 2015-04-05 DIAGNOSIS — Z8639 Personal history of other endocrine, nutritional and metabolic disease: Secondary | ICD-10-CM | POA: Insufficient documentation

## 2015-04-05 HISTORY — DX: Bipolar disorder, unspecified: F31.9

## 2015-04-05 NOTE — Progress Notes (Signed)
VASCULAR LAB PRELIMINARY  PRELIMINARY  PRELIMINARY  PRELIMINARY  Right lower extremity venous duplex completed.    Preliminary report:  No evidence of DVT or superficial thrombosis. There is an area of mixed echoes in the popliteal fossa measuring 3.75 cn by 1.73 cm longitudinal and 2.94 cm by 1.88 cm transverse consistent with a non ruptured Baker's cyst.  Rebecca Espinoza, RVS 04/05/2015, 12:31 PM

## 2015-04-05 NOTE — ED Provider Notes (Addendum)
CSN: [id number]     Arrival date & time 04/05/15  1015 History   First MD Initiated Contact with Patient 04/05/15 1108     Chief Complaint  Patient presents with  . Leg Pain     (Consider location/radiation/quality/duration/timing/severity/associated sxs/prior Treatment) HPI  55 year old female presents with swelling to the back of her right knee. Patient states that 4 days ago she was moving and lifting heavy boxes and going up and downstairs. Next couple days she had bilateral calf soreness and tenderness. However now she has noticed right popliteal swelling. Her left leg feels normal. Her No longer hurts on the right but she is concerned about a blood clot. She has also noticed some anterior knee swelling and some aching pain. She is not reversed specific injury or hitting her knee.  Patient also endorses trouble swallowing. This is been ongoing for over 4 months, she has not seen anybody about this. Several years ago, she thinks in 2010, she had an ultrasound of her neck that showed no large thyroid. She was never told that she had thyroid disease and is not on any medicines. She has been having trouble swallowing to the point that sometimes she has to lay with her neck completely hyperextended to swallow pills. She is able to eat and drink, but sometimes it is difficult. She states she doesn't have insurance and thus has not seen a PCP or any other provider about this. Is not particularly worse, but she wanted to bring it up. No neck pain or swelling.  Past Medical History  Diagnosis Date  . Acid reflux   . Depression   . Psoriasis   . Hypertension   . Bipolar 1 disorder [facility])    Past Surgical History  Procedure Laterality Date  . Abdominal hysterectomy    . Rectal prolapse repair     No family history on file. Social History  Substance Use Topics  . Smoking status: Current Every Day Smoker  . Smokeless tobacco: None  . Alcohol Use: Yes     Comment: socially   OB History    No data available     Review of Systems  HENT: Positive for trouble swallowing. Negative for sore throat.   Cardiovascular: Positive for leg swelling.  Musculoskeletal: Positive for arthralgias. Negative for neck pain.  Neurological: Negative for weakness and numbness.  All other systems reviewed and are negative.     Allergies  Review of patient's allergies indicates no known allergies.  Home Medications   Prior to Admission medications   Medication Sig Start Date End Date Taking? Authorizing Provider  dextrose 5 % SOLN 50 mL with INVESTIGATIONAL DRUG SIMPLE RECORD Inject into the skin every 7 (seven) days. 1 prefilled syringe every Monday took first dose 11-21-11 Lxekizumab know as [id number] Protocol # [id number]  Wirm [id number] By [place]. 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 11/25/11 11/24/12  Billy Fischer, MD   BP 156/95 mmHg  Pulse 90  Temp(Src) 98.4 F (36.9 C) (Oral)  Resp 20  SpO2 98% Physical Exam  Constitutional: She is oriented to person, place, and time. She appears well-developed and well-nourished.  HENT:  Head: Normocephalic and atraumatic.  Right Ear: External ear normal.  Left Ear: External ear normal.  Nose: Nose normal.  Mouth/Throat: Oropharynx is clear and moist.  Speaks in normal sentences  Eyes: Right eye exhibits no  discharge. Left eye exhibits no discharge.  Neck: Normal range of motion. Neck supple. No thyromegaly present.  No neck tenderness or swelling  Cardiovascular: Normal rate, regular rhythm and normal heart sounds.   Pulses:      Dorsalis pedis pulses are 2+ on the right side, and 2+ on the left side.  Pulmonary/Chest: Effort normal and breath sounds normal. No stridor. She has no wheezes.  Abdominal: She exhibits no distension.  Musculoskeletal:       Right knee: She exhibits swelling (mild,  inferior/anterior). Tenderness (mild) found.       Right lower leg: She exhibits no tenderness and no swelling.       Left lower leg: She exhibits no tenderness and no swelling.       Legs: Neurological: She is alert and oriented to person, place, and time.  Skin: Skin is warm and dry.  Nursing note and vitals reviewed.   ED Course  Procedures (including critical care time) Labs Review Labs Reviewed - No data to display  Imaging Review Dg Knee Complete 4 Views Right  04/05/2015  CLINICAL DATA:  Right posterior knee pain.  No history of injury. EXAM: RIGHT KNEE - COMPLETE 4+ VIEW COMPARISON:  None. FINDINGS: There is no evidence of fracture, dislocation, or joint effusion. There is no evidence of arthropathy or other focal bone abnormality. Soft tissues are unremarkable. IMPRESSION: Negative. Electronically Signed   By: Fidela Salisbury M.D.   On: 04/05/2015 11:57   VASCULAR LAB PRELIMINARY PRELIMINARY PRELIMINARY PRELIMINARY  Right lower extremity venous duplex completed.   Preliminary report: No evidence of DVT or superficial thrombosis. There is an area of mixed echoes in the popliteal fossa measuring 3.75 cn by 1.73 cm longitudinal and 2.94 cm by 1.88 cm transverse consistent with a non ruptured Baker's cyst.  SLAUGHTER, [place], RVS 04/05/2015, 12:31 PM  I have personally reviewed and evaluated these images and lab results as part of my medical decision-making.   EKG Interpretation None      MDM   Final diagnoses:  Baker's cyst, unruptured, right    No DVT on ultrasound. Most consistent with a Baker's cyst which correlates with exam. No signs of rupture. As for her swallowing difficulty this appears to be a chronic problem. She is not drooling, has no neck swelling, and no signs of acute airway distress. She appears quite well actually. Discussed importance of a PCP and she will likely need GI referral which she wants to get through her PCP. Of note, ultrasound in  2012 shows no thyromegaly.  Advised of return precautions.    Sherwood Gambler, MD 04/05/15 1624  Sherwood Gambler, MD 04/05/15 [phone]

## 2015-04-05 NOTE — ED Notes (Signed)
Pt reports noticing a "large lump" in back of RLE.  Worried about having a clot in her leg.  Denies any hx of DVT.  Pt reports she was moving Wednesday and was lifting boxes up and down the stairs.  Reports she woke up the next day with pain in bila LE and was massaging them to see if it helps the pain.

## 2015-04-05 NOTE — ED Notes (Signed)
Vascular US at bedside.

## 2015-06-30 ENCOUNTER — Emergency Department (HOSPITAL_COMMUNITY)
Admission: EM | Admit: 2015-06-30 | Discharge: 2015-06-30 | Disposition: A | Discharge status: Home / Self Care | Payer: Medicaid Other | Attending: Emergency Medicine | Admitting: Emergency Medicine

## 2015-06-30 ENCOUNTER — Emergency Department (HOSPITAL_COMMUNITY): Payer: Medicaid Other

## 2015-06-30 ENCOUNTER — Encounter (HOSPITAL_COMMUNITY): Payer: Self-pay

## 2015-06-30 DIAGNOSIS — Y9389 Activity, other specified: Secondary | ICD-10-CM | POA: Insufficient documentation

## 2015-06-30 DIAGNOSIS — F319 Bipolar disorder, unspecified: Secondary | ICD-10-CM | POA: Insufficient documentation

## 2015-06-30 DIAGNOSIS — IMO0001 Reserved for inherently not codable concepts without codable children: Secondary | ICD-10-CM

## 2015-06-30 DIAGNOSIS — Z79899 Other long term (current) drug therapy: Secondary | ICD-10-CM | POA: Insufficient documentation

## 2015-06-30 DIAGNOSIS — Y929 Unspecified place or not applicable: Secondary | ICD-10-CM | POA: Insufficient documentation

## 2015-06-30 DIAGNOSIS — S20212A Contusion of left front wall of thorax, initial encounter: Secondary | ICD-10-CM

## 2015-06-30 DIAGNOSIS — R030 Elevated blood-pressure reading, without diagnosis of hypertension: Secondary | ICD-10-CM

## 2015-06-30 DIAGNOSIS — X509XXA Other and unspecified overexertion or strenuous movements or postures, initial encounter: Secondary | ICD-10-CM | POA: Insufficient documentation

## 2015-06-30 DIAGNOSIS — I10 Essential (primary) hypertension: Secondary | ICD-10-CM | POA: Insufficient documentation

## 2015-06-30 DIAGNOSIS — F17200 Nicotine dependence, unspecified, uncomplicated: Secondary | ICD-10-CM | POA: Insufficient documentation

## 2015-06-30 DIAGNOSIS — F1210 Cannabis abuse, uncomplicated: Secondary | ICD-10-CM | POA: Insufficient documentation

## 2015-06-30 DIAGNOSIS — Y999 Unspecified external cause status: Secondary | ICD-10-CM | POA: Insufficient documentation

## 2015-06-30 NOTE — Progress Notes (Addendum)
ED CM spoke with pt about uninsured resources in [place] spoke with pt who confirms no pcp.  CM discussed and provided written information for uninsured accepting pcps, discussed the importance of pcp vs EDP services for f/u care, www.needymeds.org, www.goodrx.com, discounted pharmacies and other [employer] such as [employer], affordable care act, financial assistance, uninsured dental services, [place] med assist, DSS and  health department  Reviewed resources for [employer] uninsured accepting pcps like [facility], family medicine at [employer], [facility], [facility], local urgent care centers, [facility], [facility] family practice, general medical clinics, family services of the [place], [facility] urgent care plus others, medication resources, [facility] out patient pharmacies and housing Pt voiced understanding and appreciation of resources provided   Provided [facility] contact information pt is not eligible r/t having medicaid family planning Pt reports not having $50-75 co pays to go to discounted doctor or to pay [facility] bill Provided [facility] bill toll free number and discussed her speaking with a member of ED registration to find out possible means/payment plan of paying her chs bill  Entered in d/c instructions  Please use the resources provided to you in [place] [facility] ED to find a doctor for follow up doctor services [facility] resources will also assist with how to get assist with medication, financial resources, DSS, health dept, dental resources etc

## 2015-06-30 NOTE — ED Provider Notes (Signed)
CSN: [id number]     Arrival date & time 06/30/15  1422 History   First MD Initiated Contact with Patient 06/30/15 1505     Chief Complaint  Patient presents with  . Rib Injury     (Consider location/radiation/quality/duration/timing/severity/associated sxs/prior Treatment) The history is provided by the patient and medical records. No language interpreter was used.   Annalaura A Clyda Greener is a 55 y.o. female  with a PMH of HTN, bipolar who presents to the Emergency Department complaining of persistent left rib pain x 4 days. Patient states she was in bed and quickly looked over her shoulder (torso rotation) when she felt the acute onset of sharp pain in her left rib cage area. Patient states she has not tried any medications or remedies including ice/heat for symptoms. Pain was exacerbated yesterday when she attempted to vacuum. Pain also worse with deep breaths and coughing. Pain alleviated with rest, decreased movement. Patient denies abdominal pain, fever, shortness of breath/difficulty breathing.   Past Medical History  Diagnosis Date  . Acid reflux   . Depression   . Psoriasis   . Hypertension   . Bipolar 1 disorder [facility])    Past Surgical History  Procedure Laterality Date  . Abdominal hysterectomy    . Rectal prolapse repair     History reviewed. No pertinent family history. Social History  Substance Use Topics  . Smoking status: Current Every Day Smoker  . Smokeless tobacco: None  . Alcohol Use: Yes     Comment: socially   OB History    No data available     Review of Systems  Constitutional: Negative for fever.  HENT: Negative for congestion.   Eyes: Negative for visual disturbance.  Respiratory: Negative for shortness of breath.   Cardiovascular: Negative for chest pain.  Gastrointestinal: Negative for abdominal pain.  Genitourinary: Negative for dysuria.  Musculoskeletal: Positive for arthralgias (Rib pain).  Skin: Negative for color change.  Neurological:  Negative for syncope and weakness.      Allergies  Fish allergy  Home Medications   Prior to Admission medications   Medication Sig Start Date End Date Taking? Authorizing Provider  buPROPion (WELLBUTRIN SR) 100 MG 12 hr tablet Take 100 mg by mouth 2 (two) times daily.   Yes Historical Provider, MD  QUEtiapine (SEROQUEL) 100 MG tablet Take 100 mg by mouth at bedtime.   Yes Historical Provider, MD  venlafaxine XR (EFFEXOR-XR) 75 MG 24 hr capsule Take 75 mg by mouth 2 (two) times daily.    Yes Historical Provider, MD   BP 158/92 mmHg  Pulse 71  Temp(Src) 98.2 F (36.8 C) (Oral)  Resp 18  SpO2 100% Physical Exam  Constitutional: She is oriented to person, place, and time. She appears well-developed and well-nourished.  Alert and in no acute distress  HENT:  Head: Normocephalic and atraumatic.  Cardiovascular: Normal rate, regular rhythm and normal heart sounds.  Exam reveals no gallop and no friction rub.   No murmur heard. Pulmonary/Chest: Effort normal and breath sounds normal. No respiratory distress. She has no wheezes. She has no rales. She exhibits tenderness.    Point tenderness of left bottom rib as depicted in image. No overlying skin changes including erythema, ecchymosis, swelling, or deformity.  Abdominal: Soft. Bowel sounds are normal. She exhibits no distension and no mass. There is no tenderness. There is no rebound and no guarding.  Musculoskeletal: Normal range of motion.  Neurological: She is alert and oriented to person, place, and time.  Skin: Skin is warm and dry.  Nursing note and vitals reviewed.   ED Course  Procedures (including critical care time) Labs Review Labs Reviewed - No data to display  Imaging Review Dg Chest 2 View  06/30/2015  CLINICAL DATA:  Left lower rib pain since the patient pulled something in her chest 4 days ago when turning over in bed. Initial encounter. EXAM: CHEST  2 VIEW COMPARISON:  Single-view of the chest 11/17/2011.  FINDINGS: The lungs are clear. Heart size is normal. There is no pneumothorax or pleural effusion. No acute bony abnormality is identified. Thoracolumbar scoliosis is unchanged. IMPRESSION: No acute abnormality.  Stable compared to prior exam Electronically Signed   By: Inge Rise M.D.   On: 06/30/2015 14:52   I have personally reviewed and evaluated these images and lab results as part of my medical decision-making.   EKG Interpretation None      MDM   Final diagnoses:  Rib contusion, left, initial encounter  Elevated blood pressure   Amisha A Tolbert presents to ED for rib cage pain which began acutely after performing torso rotation. On exam, tenderness to left lower rib area with no overlying skin changes. Patient is 100% O2 on RA and denies difficulty breathing, however states pain is worse with deep breaths - will give incentive spirometer for home. CXR shows no bony abnormalities or acute cardiopulmonary disease. I'm care instructions given. Patient with mildly elevated blood pressure while in ED, encouraged follow-up with PCP in one week for a pressure recheck. Home care instructions were given, return precautions discussed, and all questions answered.    [facility] Amia Rynders, PA-C 06/30/15 1725  Julianne Rice, MD 06/30/15 Carollee Massed

## 2015-06-30 NOTE — ED Notes (Signed)
Discharge instructions and follow up care reviewed with patient. Patient verbalized understanding. 

## 2015-06-30 NOTE — ED Notes (Signed)
Pt states slept on Friday and went to turn and felt something in left rib.  Pain is increasing.  No cough.

## 2015-06-30 NOTE — ED Notes (Signed)
Patient transported to X-ray 

## 2015-06-30 NOTE — Discharge Instructions (Signed)
Use incentive spirometer multiple times throughout the day.  Ice affected area as directed above.  Tylenol as needed for pain - this is the pain reliever with the least reaction with your home medications.  Follow up with your primary care provider in one week for blood pressure check and to ensure healing of today's diagnosis.  Return to ER for new or worsening symptoms, any additional concerns.   COLD THERAPY DIRECTIONS:  Ice or gel packs can be used to reduce both pain and swelling. Ice is the most helpful within the first 24 to 48 hours after an injury or flareup from overusing a muscle or joint.  Ice is effective, has very few side effects, and is safe for most people to use.   If you expose your skin to cold temperatures for too long or without the proper protection, you can damage your skin or nerves. Watch for signs of skin damage due to cold.   HOME CARE INSTRUCTIONS  Follow these tips to use ice and cold packs safely.  Place a dry or damp towel between the ice and skin. A damp towel will cool the skin more quickly, so you may need to shorten the time that the ice is used.  For a more rapid response, add gentle compression to the ice.  Ice for no more than 10 to 20 minutes at a time. The bonier the area you are icing, the less time it will take to get the benefits of ice.  Check your skin after 5 minutes to make sure there are no signs of a poor response to cold or skin damage.  Rest 20 minutes or more in between uses.  Once your skin is numb, you can end your treatment. You can test numbness by very lightly touching your skin. The touch should be so light that you do not see the skin dimple from the pressure of your fingertip. When using ice, most people will feel these normal sensations in this order: cold, burning, aching, and numbness.

## 2015-06-30 NOTE — ED Notes (Signed)
ED PA at bedside

## 2015-07-09 LAB — TSH: TSH: 4.36 u[IU]/mL (ref 0.41–5.90)

## 2015-07-09 LAB — CBC AND DIFFERENTIAL
HCT: 42 % (ref 36–46)
Hemoglobin: 14 g/dL (ref 12.0–16.0)
Platelets: 193 10*3/uL (ref 150–399)
WBC: 5.3 10^3/mL

## 2015-07-09 LAB — HEPATIC FUNCTION PANEL
ALT: 10 U/L (ref 7–35)
AST: 15 U/L (ref 13–35)
Alkaline Phosphatase: 127 U/L — AB (ref 25–125)
Bilirubin, Total: 0.2 mg/dL

## 2015-07-09 LAB — C-REACTIVE PROTEIN: CRP: 0.3 mg/dL

## 2015-07-09 LAB — LIPID PANEL
CHOLESTEROL: 259 mg/dL — AB (ref 0–200)
HDL: 68 mg/dL (ref 35–70)
LDL Cholesterol: 134 mg/dL
LDl/HDL Ratio: 2
Triglycerides: 283 mg/dL — AB (ref 40–160)

## 2015-07-09 LAB — BASIC METABOLIC PANEL
BUN: 14 mg/dL (ref 4–21)
Creatinine: 0.9 mg/dL (ref 0.5–1.1)
GLUCOSE: 74 mg/dL
Potassium: 4.6 mmol/L (ref 3.4–5.3)
Sodium: 143 mmol/L (ref 137–147)

## 2015-07-09 LAB — VITAMIN D 25 HYDROXY (VIT D DEFICIENCY, FRACTURES): Vit D, 25-Hydroxy: 18.5

## 2015-11-26 DIAGNOSIS — E559 Vitamin D deficiency, unspecified: Secondary | ICD-10-CM | POA: Insufficient documentation

## 2015-11-26 DIAGNOSIS — M1990 Unspecified osteoarthritis, unspecified site: Secondary | ICD-10-CM | POA: Insufficient documentation

## 2015-11-26 DIAGNOSIS — Z803 Family history of malignant neoplasm of breast: Secondary | ICD-10-CM | POA: Insufficient documentation

## 2015-11-26 DIAGNOSIS — F430 Acute stress reaction: Secondary | ICD-10-CM | POA: Insufficient documentation

## 2015-11-26 DIAGNOSIS — M2550 Pain in unspecified joint: Secondary | ICD-10-CM | POA: Insufficient documentation

## 2015-11-26 DIAGNOSIS — F331 Major depressive disorder, recurrent, moderate: Secondary | ICD-10-CM | POA: Insufficient documentation

## 2015-11-30 ENCOUNTER — Ambulatory Visit (INDEPENDENT_AMBULATORY_CARE_PROVIDER_SITE_OTHER): Payer: Self-pay | Admitting: Family Medicine

## 2015-11-30 ENCOUNTER — Encounter: Payer: Self-pay | Admitting: Family Medicine

## 2015-11-30 VITALS — BP 125/74 | HR 78 | Ht 65.0 in | Wt 152.4 lb

## 2015-11-30 DIAGNOSIS — E559 Vitamin D deficiency, unspecified: Secondary | ICD-10-CM

## 2015-11-30 DIAGNOSIS — Z803 Family history of malignant neoplasm of breast: Secondary | ICD-10-CM

## 2015-11-30 DIAGNOSIS — Z9882 Breast implant status: Secondary | ICD-10-CM | POA: Insufficient documentation

## 2015-11-30 DIAGNOSIS — K219 Gastro-esophageal reflux disease without esophagitis: Secondary | ICD-10-CM | POA: Insufficient documentation

## 2015-11-30 DIAGNOSIS — L408 Other psoriasis: Secondary | ICD-10-CM

## 2015-11-30 DIAGNOSIS — F1290 Cannabis use, unspecified, uncomplicated: Secondary | ICD-10-CM

## 2015-11-30 DIAGNOSIS — E782 Mixed hyperlipidemia: Secondary | ICD-10-CM

## 2015-11-30 DIAGNOSIS — Z8742 Personal history of other diseases of the female genital tract: Secondary | ICD-10-CM

## 2015-11-30 DIAGNOSIS — F1220 Cannabis dependence, uncomplicated: Secondary | ICD-10-CM

## 2015-11-30 DIAGNOSIS — L308 Other specified dermatitis: Secondary | ICD-10-CM | POA: Insufficient documentation

## 2015-11-30 DIAGNOSIS — F3160 Bipolar disorder, current episode mixed, unspecified: Secondary | ICD-10-CM

## 2015-11-30 DIAGNOSIS — R413 Other amnesia: Secondary | ICD-10-CM | POA: Insufficient documentation

## 2015-11-30 DIAGNOSIS — I10 Essential (primary) hypertension: Secondary | ICD-10-CM

## 2015-11-30 DIAGNOSIS — Z87891 Personal history of nicotine dependence: Secondary | ICD-10-CM

## 2015-11-30 DIAGNOSIS — Z90710 Acquired absence of both cervix and uterus: Secondary | ICD-10-CM

## 2015-11-30 DIAGNOSIS — R5382 Chronic fatigue, unspecified: Secondary | ICD-10-CM

## 2015-11-30 NOTE — Progress Notes (Signed)
New patient office visit note:  Impression and Recommendations:    1. Hypertension, benign essential, goal below 140/90   2. Mixed hyperlipidemia   3. History of tobacco use disorder- quit 2/17   4. Family history of breast cancer in first degree relative   5. H/O abnormal cervical Papanicolaou smear   6. Avitaminosis D   7. Bipolar I disorder, most recent episode mixed ([place])   8. Gastroesophageal reflux disease, esophagitis presence not specified   9. Psoriasiform dermatitis   10. Chronic fatigue   11. Memory changes   12. Status post hysterectomy   13. Marijuana smoker, continuous ([place])    Bipolar I disorder, most recent episode mixed ([place]) Was going to [facility] in [place], but recently seen by [facility] in [place]- Dr Tye [place], went there 11th Sept.   Explained to patient in terms of her psychiatric complaints, she will need to address these with her psychiatrist.  Psoriasis B/l Lext.    Psoriasiform dermatitis B/l lext.- chronic- stable.   Family history of breast cancer in first degree relative MOm died age 66; dx age 53.  Last Mammo- was 05/12/14 at [place]  ----> pt will call for appt for reck As soon asw possible. She understands importance of this  H/O abnormal cervical Papanicolaou smear Has Medicaid for female care. Will make appointment for CPE.   Memory changes Ever since stress increased and went on psych meds. Getting W.    - Told patient she will need to address this with her psychiatrist.  Explained that I do not treat bipolar and I'm not familiar with all the medications she is on for this condition.  Avitaminosis D Patient was told in the past her vitamin D was low  Labs placed for near future.  Hypertension, benign essential, goal below 140/90 Stable. Well controlled. Cont meds.  We'll obtain blood work near future  Mixed hyperlipidemia Patient denies that she was ever on medications for this. We will check fasting lipid panel near  future  History of tobacco use disorder- quit 2/17 Patient understands with this history she is at increased risk for cancers, heart attack back/strokes and other various diseases  - Explained to patient in terms of her other multiple ongoing chronic issues/concerns, she will have to identify her 2 most important ones and make multiple follow-up office visits to discuss a couple at each office visit. Highly encouraged to do so in near future.  She will make a follow-up with her psychiatrist as well as follow-up for mammogram, and also told to make a follow-up for Pap smear etc.  Orders Placed This Encounter  Procedures  . CBC with Differential/Platelet  . COMPLETE METABOLIC PANEL WITH GFR  . Lipid panel  . Hemoglobin A1c  . T4, free  . TSH  . VITAMIN D 25 Hydroxy (Vit-D Deficiency, Fractures)  . Sedimentation rate  . RPR  . HIV antibody (with reflex)    New Prescriptions   No medications on file    Modified Medications   No medications on file    Discontinued Medications   No medications on file    Current Meds  Medication Sig  . buPROPion (WELLBUTRIN SR) 100 MG 12 hr tablet Take 100 mg by mouth 2 (two) times daily.  . hydrochlorothiazide (HYDRODIURIL) 25 MG tablet Take 25 mg by mouth daily.  Marland Kitchen lamoTRIgine (LAMICTAL) 25 MG tablet Take 50 mg by mouth at bedtime.  Marland Kitchen QUEtiapine (SEROQUEL) 100 MG tablet Take  100 mg by mouth at bedtime.  Marland Kitchen venlafaxine XR (EFFEXOR-XR) 75 MG 24 hr capsule Take 75 mg by mouth daily.     The patient was counseled, risk factors were discussed, anticipatory guidance given.  Gross side effects, risk and benefits, and alternatives of medications discussed with patient.  Patient is aware that all medications have potential side effects and we are unable to predict every side effect or drug-drug interaction that may occur.  Expresses verbal understanding and consents to current therapy plan and treatment regimen.  Return for f/up fasting bloodwrok near  future and then f/up OV to discuss results. .  Please see AVS handed out to patient at the end of our visit for further patient instructions/ counseling done pertaining to today's office visit.    Note: This document was prepared using Dragon voice recognition software and may include unintentional dictation errors.  ----------------------------------------------------------------------------------------------------------------------    Subjective:    Chief Complaint  Patient presents with  . Establish Care    HPI: Rebecca Espinoza is a pleasant 55 y.o. female who presents to [place] at [facility] today to review their medical history with me and establish care.     She also would like to discuss, if there is time, her "weak bones", cholesterol levels b/c of worry about having heart attack/stroke, anxiety, depression, possible memory loss, changes in birthmarks, "bad shoulder due to Baker's cyst", chronic back pain, clicking and creaking in her neck and also blood pressure refills.   Not employed currently:  due to bi-polar.  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   Single, divorced- 4 kids and 6 grandchildren.    Bad relationship w Ex-husband.   HTN- since 2013;   L rotator cuff injury- difficult to raise arm etc. Never been evaluated.    L breast lump- had [place]- last eval was over a year ago- 2015      Wt Readings from Last 3 Encounters:  11/30/15 152 lb 6.4 oz (69.1 kg)   BP Readings from Last 3 Encounters:  11/30/15 125/74  06/30/15 158/92  04/05/15 152/90   Pulse Readings from Last 3 Encounters:  11/30/15 78  06/30/15 71  04/05/15 70   BMI Readings from Last 3 Encounters:  11/30/15 25.36 kg/m    Patient Active Problem List   Diagnosis Date Noted  . Mixed hyperlipidemia 11/30/2015    Priority: High  . Hypertension, benign essential, goal below 140/90 11/30/2015    Priority: High  . History of tobacco use disorder- quit 2/17 12/06/2015     Priority: Medium  . H/O abnormal cervical Papanicolaou smear 11/30/2015    Priority: Medium  . Family history of breast cancer in first degree relative 11/26/2015    Priority: Medium  . Status post hysterectomy 12/06/2015  . Bipolar I disorder, most recent episode mixed ([place]) 11/30/2015  . GERD (gastroesophageal reflux disease) 11/30/2015  . Psoriasiform dermatitis 11/30/2015  . History of breast augmentation 11/30/2015  . Chronic fatigue 11/30/2015  . Memory changes 11/30/2015  . Marijuana smoker, continuous ([place]) 11/30/2015  . Ache in joint 11/26/2015  . Gen Arthritis 11/26/2015  . Moderate episode of recurrent major depressive disorder ([place]) 11/26/2015  . Acute stress disorder 11/26/2015  . Avitaminosis D 11/26/2015     Past Medical History:  Diagnosis Date  . Acid reflux   . Bipolar 1 disorder ([place])   . Depression   . Hyperlipidemia   . Hypertension   . Psoriasis  Past Medical History:  Diagnosis Date  . Acid reflux   . Bipolar 1 disorder ([place])   . Depression   . Hyperlipidemia   . Hypertension   . Psoriasis      Past Surgical History:  Procedure Laterality Date  . ABDOMINAL HYSTERECTOMY    . BREAST ENHANCEMENT SURGERY    . RECTAL PROLAPSE REPAIR    . TONSILLECTOMY       Family History  Problem Relation Age of Onset  . Cancer Mother     breast, age 61  . Alcohol abuse Father   . Depression Father      History  Drug Use  . Types: Marijuana    History  Alcohol Use  . Yes    Comment: socially    History  Smoking Status  . Former Smoker  . Quit date: 04/15/2015  Smokeless Tobacco  . Never Used     Patient's Medications  New Prescriptions   No medications on file  Previous Medications   BUPROPION (WELLBUTRIN SR) 100 MG 12 HR TABLET    Take 100 mg by mouth 2 (two) times daily.   HYDROCHLOROTHIAZIDE (HYDRODIURIL) 25 MG TABLET    Take 25 mg by mouth daily.   LAMOTRIGINE (LAMICTAL) 25 MG TABLET    Take 50 mg by mouth at bedtime.    QUETIAPINE (SEROQUEL) 100 MG TABLET    Take 100 mg by mouth at bedtime.   VENLAFAXINE XR (EFFEXOR-XR) 75 MG 24 HR CAPSULE    Take 75 mg by mouth daily.   Modified Medications   No medications on file  Discontinued Medications   No medications on file    Allergies: Fish allergy  Review of Systems  Constitutional: Negative.   HENT: Negative.   Eyes: Negative.   Respiratory: Negative.   Cardiovascular: Negative.   Gastrointestinal: Negative.   Genitourinary: Negative.   Musculoskeletal: Positive for back pain, joint pain and myalgias. Negative for falls.  Skin: Positive for rash.       Psoriasis-chronic  Neurological: Negative.        Memory loss  Endo/Heme/Allergies: Negative.   Psychiatric/Behavioral: Positive for depression and memory loss. Negative for hallucinations and suicidal ideas. The patient is nervous/anxious.        Anxiety, bipolar-     Objective:    Blood pressure 125/74, pulse 78, height 5\' 5"  (1.651 m), weight 152 lb 6.4 oz (69.1 kg). Body mass index is 25.36 kg/m. General: Well Developed, well nourished, and in no acute distress.  Neuro: Alert and oriented x3, extra-ocular muscles intact, sensation grossly intact.  HEENT: Normocephalic, atraumatic, pupils equal round reactive to light, neck supple, no gross masses, no carotid bruits, no JVD apprec Skin: no gross suspicious lesions or rashes  Cardiac: Regular rate and rhythm, no murmurs rubs or gallops.  Respiratory: Essentially clear to auscultation bilaterally. Not using accessory muscles, speaking in full sentences.  Abdominal: Soft, not grossly distended Musculoskeletal: Ambulates w/o diff, FROM * 4 ext.  Vasc: less 2 sec cap RF, warm and pink  Psych:  No HI/SI, judgement and insight good, Euthymic mood. Full Affect.

## 2015-11-30 NOTE — Assessment & Plan Note (Signed)
Has Medicaid for female care. Will make appointment for CPE.

## 2015-11-30 NOTE — Assessment & Plan Note (Addendum)
Was going to [facility] in [place], but recently seen by [facility] in [place]- Dr Tye [place], went there 11th Sept.   Explained to patient in terms of her psychiatric complaints, she will need to address these with her psychiatrist.

## 2015-11-30 NOTE — Patient Instructions (Addendum)
Calcium; Vitamin D chewable tablet What is this medicine? CALCIUM; VITAMIN D (KAL see um; VYE ta min D) is a vitamin supplement. It is used to prevent conditions of low calcium and vitamin D. This medicine may be used for other purposes; ask your health care provider or pharmacist if you have questions. What should I tell my health care provider before I take this medicine? They need to know if you have any of these conditions: -constipation -dehydration -heart disease -high level of calcium or vitamin D in the blood -high level of phosphate in the blood -kidney disease -kidney stones -liver disease -parathyroid disease -sarcoidosis -stomach ulcer or obstruction -an unusual or allergic reaction to calcium, vitamin D, tartrazine dye, other medicines, foods, dyes, or preservatives -pregnant or trying to get pregnant -breast-feeding How should I use this medicine? Take this medicine by mouth. Chew it completely before swallowing. Follow the directions on the prescription label. Take with food or within 1 hour after a meal. Take your medicine at regular intervals. Do not take your medicine more often than directed. Talk to your pediatrician regarding the use of this medicine in children. While this medicine may be used in children for selected conditions, precautions do apply. Overdosage: If you think you have taken too much of this medicine contact a poison control center or emergency room at once. NOTE: This medicine is only for you. Do not share this medicine with others. What if I miss a dose? If you miss a dose, take it as soon as you can. If it is almost time for your next dose, take only that dose. Do not take double or extra doses. What may interact with this medicine? Do not take this medicine with any of the following medications: -ammonium chloride -methenamine This medicine may also interact with the following medications: -antibiotics like ciprofloxacin, gatifloxacin,  tetracycline -captopril -delavirdine -diuretics -gabapentin -iron supplements -medicines for fungal infections like ketoconazole and itraconazole -medicines for seizures like ethotoin and phenytoin -mineral oil -mycophenolate -other vitamins with calcium, vitamin D, or minerals -quinidine -rosuvastatin -sucralfate -thyroid medicine This list may not describe all possible interactions. Give your health care provider a list of all the medicines, herbs, non-prescription drugs, or dietary supplements you use. Also tell them if you smoke, drink alcohol, or use illegal drugs. Some items may interact with your medicine. What should I watch for while using this medicine? Taking this medicine is not a substitute for a well-balanced diet and exercise. Talk with your doctor or health care provider and follow a healthy lifestyle. Do not take this medicine with high-fiber foods, large amounts of alcohol, or drinks containing caffeine. Do not take this medicine within 2 hours of any other medicines. What side effects may I notice from receiving this medicine? Side effects that you should report to your doctor or health care professional as soon as possible: -allergic reactions like skin rash, itching or hives, swelling of the face, lips, or tongue -confusion -dry mouth -high blood pressure -increased hunger or thirst -increased urination -irregular heartbeat -metallic taste -muscle or bone pain -pain when urinating -seizure -unusually weak or tired -weight loss Side effects that usually do not require medical attention (report to your doctor or health care professional if they continue or are bothersome): -constipation -diarrhea -headache -loss of appetite -nausea, vomiting -stomach upset This list may not describe all possible side effects. Call your doctor for medical advice about side effects. You may report side effects to FDA at 1-800-FDA-1088.  Where should I keep my medicine? Keep  out of the reach of children. Store at room temperature between 15 and 30 degrees C (59 and 86 degrees F). Protect from light. Keep container tightly closed. Throw away any unused medicine after the expiration date. NOTE: This sheet is a summary. It may not cover all possible information. If you have questions about this medicine, talk to your doctor, pharmacist, or health care provider.    2016, Elsevier/Gold Standard. (2007-06-13 17:57:27)       Guidelines for a Low Cholesterol, Low Saturated Fat Diet   Fats - Limit total intake of fats and oils. - Avoid butter, stick margarine, shortening, lard, palm and coconut oils. - Limit mayonnaise, salad dressings, gravies and sauces, unless they are homemade with low-fat ingredients. - Limit chocolate. - Choose low-fat and nonfat products, such as low-fat mayonnaise, low-fat or non-hydrogenated peanut butter, low-fat or fat-free salad dressings and nonfat gravy. - Use vegetable oil, such as canola or olive oil. - Look for margarine that does not contain trans fatty acids. - Use nuts in moderate amounts. - Read ingredient labels carefully to determine both amount and type of fat present in foods. Limit saturated and trans fats! - Avoid high-fat processed and convenience foods.  Meats and Meat Alternatives - Choose fish, chicken, [place] and lean meats. - Use dried beans, peas, lentils and tofu. - Limit egg yolks to three to four per week. - If you eat red meat, limit to no more than three servings per week and choose loin or round cuts. - Avoid fatty meats, such as bacon, sausage, franks, luncheon meats and ribs. - Avoid all organ meats, including liver.  Dairy - Choose nonfat or low-fat milk, yogurt and cottage cheese. - Most cheeses are high in fat. Choose cheeses made from non-fat milk, such as mozzarella and ricotta cheese. - Choose light or fat-free cream cheese and sour cream. - Avoid cream and sauces made with cream.  Fruits  and Vegetables - Eat a wide variety of fruits and vegetables. - Use lemon juice, vinegar or "mist" olive oil on vegetables. - Avoid adding sauces, fat or oil to vegetables.  Breads, Cereals and Grains - Choose whole-grain breads, cereals, pastas and rice. - Avoid high-fat snack foods, such as granola, cookies, pies, pastries, doughnuts and croissants.  Cooking Tips - Avoid deep fried foods. - Trim visible fat off meats and remove skin from poultry before cooking. - Bake, broil, boil, poach or roast poultry, fish and lean meats. - Drain and discard fat that drains out of meat as you cook it. - Add little or no fat to foods. - Use vegetable oil sprays to grease pans for cooking or baking. - Steam vegetables. - Use herbs or no-oil marinades to flavor foods.

## 2015-11-30 NOTE — Assessment & Plan Note (Addendum)
Ever since stress increased and went on psych meds. Getting W.    - Told patient she will need to address this with her psychiatrist.  Explained that I do not treat bipolar and I'm not familiar with all the medications she is on for this condition.

## 2015-11-30 NOTE — Assessment & Plan Note (Addendum)
B/l lext.- chronic- stable.

## 2015-11-30 NOTE — Assessment & Plan Note (Signed)
B/l Lext.

## 2015-11-30 NOTE — Assessment & Plan Note (Addendum)
MOm died age 55; dx age 76.  Last Mammo- was 05/12/14 at [place]  ----> pt will call for appt for reck As soon asw possible. She understands importance of this

## 2015-12-06 DIAGNOSIS — Z90710 Acquired absence of both cervix and uterus: Secondary | ICD-10-CM | POA: Insufficient documentation

## 2015-12-06 DIAGNOSIS — Z87891 Personal history of nicotine dependence: Secondary | ICD-10-CM | POA: Insufficient documentation

## 2015-12-06 NOTE — Assessment & Plan Note (Signed)
Patient was told in the past her vitamin D was low  Labs placed for near future.

## 2015-12-06 NOTE — Assessment & Plan Note (Addendum)
Stable. Well controlled. Cont meds.  We'll obtain blood work near future

## 2015-12-06 NOTE — Assessment & Plan Note (Signed)
Patient understands with this history she is at increased risk for cancers, heart attack back/strokes and other various diseases

## 2015-12-06 NOTE — Assessment & Plan Note (Signed)
Patient denies that she was ever on medications for this. We will check fasting lipid panel near future

## 2015-12-08 ENCOUNTER — Other Ambulatory Visit (HOSPITAL_COMMUNITY): Payer: Self-pay | Admitting: *Deleted

## 2015-12-08 DIAGNOSIS — N644 Mastodynia: Secondary | ICD-10-CM

## 2015-12-08 DIAGNOSIS — N6320 Unspecified lump in the left breast, unspecified quadrant: Secondary | ICD-10-CM

## 2015-12-09 ENCOUNTER — Encounter: Payer: Self-pay | Admitting: Family Medicine

## 2015-12-09 ENCOUNTER — Other Ambulatory Visit (INDEPENDENT_AMBULATORY_CARE_PROVIDER_SITE_OTHER): Payer: Self-pay

## 2015-12-09 DIAGNOSIS — E559 Vitamin D deficiency, unspecified: Secondary | ICD-10-CM

## 2015-12-09 DIAGNOSIS — F3160 Bipolar disorder, current episode mixed, unspecified: Secondary | ICD-10-CM

## 2015-12-09 DIAGNOSIS — L308 Other specified dermatitis: Secondary | ICD-10-CM

## 2015-12-09 DIAGNOSIS — R413 Other amnesia: Secondary | ICD-10-CM

## 2015-12-09 DIAGNOSIS — Z803 Family history of malignant neoplasm of breast: Secondary | ICD-10-CM

## 2015-12-09 DIAGNOSIS — K219 Gastro-esophageal reflux disease without esophagitis: Secondary | ICD-10-CM

## 2015-12-09 DIAGNOSIS — I10 Essential (primary) hypertension: Secondary | ICD-10-CM

## 2015-12-09 DIAGNOSIS — R5382 Chronic fatigue, unspecified: Secondary | ICD-10-CM

## 2015-12-09 DIAGNOSIS — L408 Other psoriasis: Secondary | ICD-10-CM

## 2015-12-09 DIAGNOSIS — E782 Mixed hyperlipidemia: Secondary | ICD-10-CM

## 2015-12-09 DIAGNOSIS — Z8742 Personal history of other diseases of the female genital tract: Secondary | ICD-10-CM

## 2015-12-10 LAB — CBC WITH DIFFERENTIAL/PLATELET
BASOS PCT: 0 %
Basophils Absolute: 0 cells/uL (ref 0–200)
EOS ABS: 110 {cells}/uL (ref 15–500)
EOS PCT: 2 %
HCT: 41.3 % (ref 35.0–45.0)
Hemoglobin: 13.8 g/dL (ref 11.7–15.5)
Lymphocytes Relative: 25 %
Lymphs Abs: 1375 cells/uL (ref 850–3900)
MCH: 29.5 pg (ref 27.0–33.0)
MCHC: 33.4 g/dL (ref 32.0–36.0)
MCV: 88.2 fL (ref 80.0–100.0)
MONOS PCT: 9 %
MPV: 10.1 fL (ref 7.5–12.5)
Monocytes Absolute: 495 cells/uL (ref 200–950)
NEUTROS ABS: 3520 {cells}/uL (ref 1500–7800)
Neutrophils Relative %: 64 %
PLATELETS: 164 10*3/uL (ref 140–400)
RBC: 4.68 MIL/uL (ref 3.80–5.10)
RDW: 14 % (ref 11.0–15.0)
WBC: 5.5 10*3/uL (ref 3.8–10.8)

## 2015-12-10 LAB — LIPID PANEL
CHOL/HDL RATIO: 3.6 ratio (ref <=5.0)
CHOLESTEROL: 257 mg/dL — AB (ref 125–200)
HDL: 71 mg/dL (ref >=46)
LDL Cholesterol: 161 mg/dL — ABNORMAL HIGH (ref <130)
Triglycerides: 124 mg/dL (ref <150)
VLDL: 25 mg/dL (ref <30)

## 2015-12-10 LAB — COMPLETE METABOLIC PANEL WITH GFR
ALT: 10 U/L (ref 6–29)
AST: 18 U/L (ref 10–35)
Albumin: 4.1 g/dL (ref 3.6–5.1)
Alkaline Phosphatase: 90 U/L (ref 33–130)
BILIRUBIN TOTAL: 0.6 mg/dL (ref 0.2–1.2)
BUN: 17 mg/dL (ref 7–25)
CO2: 29 mmol/L (ref 20–31)
CREATININE: 1 mg/dL (ref 0.50–1.05)
Calcium: 8.9 mg/dL (ref 8.6–10.4)
Chloride: 101 mmol/L (ref 98–110)
GFR, Est African American: 73 mL/min (ref >=60)
GFR, Est Non African American: 63 mL/min (ref >=60)
GLUCOSE: 89 mg/dL (ref 65–99)
Potassium: 3.3 mmol/L — ABNORMAL LOW (ref 3.5–5.3)
SODIUM: 142 mmol/L (ref 135–146)
TOTAL PROTEIN: 6.6 g/dL (ref 6.1–8.1)

## 2015-12-10 LAB — SEDIMENTATION RATE: SED RATE: 7 mm/h (ref 0–30)

## 2015-12-10 LAB — HEMOGLOBIN A1C
Hgb A1c MFr Bld: 5 % (ref <5.7)
Mean Plasma Glucose: 97 mg/dL

## 2015-12-10 LAB — TSH: TSH: 6.92 mIU/L — ABNORMAL HIGH

## 2015-12-10 LAB — T4, FREE: Free T4: 0.9 ng/dL (ref 0.8–1.8)

## 2015-12-10 LAB — RPR

## 2015-12-10 LAB — VITAMIN D 25 HYDROXY (VIT D DEFICIENCY, FRACTURES): VIT D 25 HYDROXY: 22 ng/mL — AB (ref 30–100)

## 2015-12-11 MED ORDER — TRIAMCINOLONE ACETONIDE 0.1 % EX CREA: 1.0000 "application " | TOPICAL_CREAM | Freq: Two times a day (BID) | CUTANEOUS | 0 refills | Status: DC

## 2015-12-11 NOTE — Addendum Note (Signed)
Addended by: Mellody Dance on: 12/11/2015 08:11 AM   Modules accepted: Orders

## 2015-12-15 ENCOUNTER — Other Ambulatory Visit: Payer: Self-pay

## 2015-12-15 DIAGNOSIS — R7989 Other specified abnormal findings of blood chemistry: Secondary | ICD-10-CM

## 2015-12-16 ENCOUNTER — Other Ambulatory Visit (INDEPENDENT_AMBULATORY_CARE_PROVIDER_SITE_OTHER): Payer: Self-pay

## 2015-12-16 DIAGNOSIS — R946 Abnormal results of thyroid function studies: Secondary | ICD-10-CM

## 2015-12-16 DIAGNOSIS — R7989 Other specified abnormal findings of blood chemistry: Secondary | ICD-10-CM

## 2015-12-17 ENCOUNTER — Ambulatory Visit (INDEPENDENT_AMBULATORY_CARE_PROVIDER_SITE_OTHER): Payer: Self-pay | Admitting: Family Medicine

## 2015-12-17 ENCOUNTER — Encounter: Payer: Self-pay | Admitting: Family Medicine

## 2015-12-17 VITALS — BP 131/82 | HR 80 | Ht 65.0 in | Wt 151.3 lb

## 2015-12-17 DIAGNOSIS — E038 Other specified hypothyroidism: Secondary | ICD-10-CM

## 2015-12-17 DIAGNOSIS — E876 Hypokalemia: Secondary | ICD-10-CM

## 2015-12-17 DIAGNOSIS — E039 Hypothyroidism, unspecified: Secondary | ICD-10-CM

## 2015-12-17 DIAGNOSIS — E7800 Pure hypercholesterolemia, unspecified: Secondary | ICD-10-CM

## 2015-12-17 DIAGNOSIS — I10 Essential (primary) hypertension: Secondary | ICD-10-CM

## 2015-12-17 DIAGNOSIS — E782 Mixed hyperlipidemia: Secondary | ICD-10-CM

## 2015-12-17 DIAGNOSIS — Z87891 Personal history of nicotine dependence: Secondary | ICD-10-CM

## 2015-12-17 DIAGNOSIS — E559 Vitamin D deficiency, unspecified: Secondary | ICD-10-CM

## 2015-12-17 LAB — T3, FREE: T3 FREE: 3 pg/mL (ref 2.3–4.2)

## 2015-12-17 NOTE — Patient Instructions (Addendum)
You can look up potassium rich foods and just try to eat more of those foods  Also you are not drinking enough water. You should be taking at least 80 ounces of water per day if you are not sweating or outside for any prolonged period of time.  Please take in at least 1200 mg of calcium per day to help with prevention of osteoporosis. Yes also since her vitamin D is so low please take 5000 IUs daily. You need to do this in order to help with fatigue, muscle fatigue and also strong bones  Reck-  TSH, T3 and Free T4, FLP, K level, vit D in about 4-28months.     Please realize, EXERCISE IS MEDICINE!  -  American Heart Association [facility]) guidelines for exercise : If you are in good health, without any medical conditions, you should engage in 150 minutes of moderate intensity aerobic activity per week.  This means you should be huffing and puffing throughout your workout.   Engaging in regular exercise will improve brain function and memory, as well as improve mood, boost immune system and help with weight management.  As well as the other, more well-known effects of exercise such as decreasing blood sugar levels, decreasing blood pressure,  and decreasing bad cholesterol levels/ increasing good cholesterol levels.     -  The AHA strongly endorses consumption of a diet that contains a variety of foods from all the food categories with an emphasis on fruits and vegetables; fat-free and low-fat dairy products; cereal and grain products; legumes and nuts; and fish, poultry, and/or extra lean meats.    Excessive food intake, especially of foods high in saturated and trans fats, sugar, and salt, should be avoided.    Adequate water intake of roughly 1/2 of your weight in pounds, should equal the ounces of water per day you should drink.  So for instance, if you're 200 pounds, that would be 100 ounces of water per day.         Mediterranean Diet  Why follow it? Research shows. . Those who follow the  Mediterranean diet have a reduced risk of heart disease  . The diet is associated with a reduced incidence of Parkinson's and Alzheimer's diseases . People following the diet may have longer life expectancies and lower rates of chronic diseases  . The Dietary Guidelines for Americans recommends the Mediterranean diet as an eating plan to promote health and prevent disease  What Is the Mediterranean Diet?  . Healthy eating plan based on typical foods and recipes of Mediterranean-style cooking . The diet is primarily a plant based diet; these foods should make up a majority of meals   Starches - Plant based foods should make up a majority of meals - They are an important sources of vitamins, minerals, energy, antioxidants, and fiber - Choose whole grains, foods high in fiber and minimally processed items  - Typical grain sources include wheat, oats, barley, corn, brown rice, bulgar, farro, millet, polenta, couscous  - Various types of beans include chickpeas, lentils, fava beans, black beans, white beans   Fruits  Veggies - Large quantities of antioxidant rich fruits & veggies; 6 or more servings  - Vegetables can be eaten raw or lightly drizzled with oil and cooked  - Vegetables common to the traditional Mediterranean Diet include: artichokes, arugula, beets, broccoli, brussel sprouts, cabbage, carrots, celery, collard greens, cucumbers, eggplant, kale, leeks, lemons, lettuce, mushrooms, okra, onions, peas, peppers, potatoes, pumpkin, radishes, rutabaga, shallots,  spinach, sweet potatoes, turnips, zucchini - Fruits common to the Mediterranean Diet include: apples, apricots, avocados, cherries, clementines, dates, figs, grapefruits, grapes, melons, nectarines, oranges, peaches, pears, pomegranates, strawberries, tangerines  Fats - Replace butter and margarine with healthy oils, such as olive oil, canola oil, and tahini  - Limit nuts to no more than a handful a day  - Nuts include walnuts, almonds,  pecans, pistachios, pine nuts  - Limit or avoid candied, honey roasted or heavily salted nuts - Olives are central to the [employer] - can be eaten whole or used in a variety of dishes   Meats Protein - Limiting red meat: no more than a few times a month - When eating red meat: choose lean cuts and keep the portion to the size of deck of cards - Eggs: approx. 0 to 4 times a week  - Fish and lean poultry: at least 2 a week  - Healthy protein sources include, chicken, [place], lean beef, lamb - Increase intake of seafood such as tuna, salmon, trout, mackerel, shrimp, scallops - Avoid or limit high fat processed meats such as sausage and bacon  Dairy - Include moderate amounts of low fat dairy products  - Focus on healthy dairy such as fat free yogurt, skim milk, low or reduced fat cheese - Limit dairy products higher in fat such as whole or 2% milk, cheese, ice cream  Alcohol - Moderate amounts of red wine is ok  - No more than 5 oz daily for women (all ages) and men older than age 58  - No more than 10 oz of wine daily for men younger than 81  Other - Limit sweets and other desserts  - Use herbs and spices instead of salt to flavor foods  - Herbs and spices common to the traditional Mediterranean Diet include: basil, bay leaves, chives, cloves, cumin, fennel, garlic, lavender, marjoram, mint, oregano, parsley, pepper, rosemary, sage, savory, sumac, tarragon, thyme   It's not just a diet, it's a lifestyle:  . The Mediterranean diet includes lifestyle factors typical of those in the region  . Foods, drinks and meals are best eaten with others and savored . Daily physical activity is important for overall good health . This could be strenuous exercise like running and aerobics . This could also be more leisurely activities such as walking, housework, yard-work, or taking the stairs . Moderation is the key; a balanced and healthy diet accommodates most foods and drinks . Consider portion  sizes and frequency of consumption of certain foods   Meal Ideas & Options:  . Breakfast:  o Whole wheat toast or whole wheat English muffins with peanut butter & hard boiled egg o Steel cut oats topped with apples & cinnamon and skim milk  o Fresh fruit: banana, strawberries, melon, berries, peaches  o Smoothies: strawberries, bananas, greek yogurt, peanut butter o Low fat greek yogurt with blueberries and granola  o Egg white omelet with spinach and mushrooms o Breakfast couscous: whole wheat couscous, apricots, skim milk, cranberries  . Sandwiches:  o Hummus and grilled vegetables (peppers, zucchini, squash) on whole wheat bread   o Grilled chicken on whole wheat pita with lettuce, tomatoes, cucumbers or tzatziki  o Tuna salad on whole wheat bread: tuna salad made with greek yogurt, olives, red peppers, capers, green onions o Garlic rosemary lamb pita: lamb sauted with garlic, rosemary, salt & pepper; add lettuce, cucumber, greek yogurt to pita - flavor with lemon juice and black pepper  .  Seafood:  o Mediterranean grilled salmon, seasoned with garlic, basil, parsley, lemon juice and black pepper o Shrimp, lemon, and spinach whole-grain pasta salad made with low fat greek yogurt  o Seared scallops with lemon orzo  o Seared tuna steaks seasoned salt, pepper, coriander topped with tomato mixture of olives, tomatoes, olive oil, minced garlic, parsley, green onions and cappers  . Meats:  o Herbed greek chicken salad with kalamata olives, cucumber, feta  o Red bell peppers stuffed with spinach, bulgur, lean ground beef (or lentils) & topped with feta   o Kebabs: skewers of chicken, tomatoes, onions, zucchini, squash  o [place] burgers: made with red onions, mint, dill, lemon juice, feta cheese topped with roasted red peppers . Vegetarian o Cucumber salad: cucumbers, artichoke hearts, celery, red onion, feta cheese, tossed in olive oil & lemon juice  o Hummus and whole grain pita points with  a greek salad (lettuce, tomato, feta, olives, cucumbers, red onion) o Lentil soup with celery, carrots made with vegetable broth, garlic, salt and pepper  o Tabouli salad: parsley, bulgur, mint, scallions, cucumbers, tomato, radishes, lemon juice, olive oil, salt and pepper.     Guidelines for a Low Cholesterol, Low Saturated Fat Diet   Fats - Limit total intake of fats and oils. - Avoid butter, stick margarine, shortening, lard, palm and coconut oils. - Limit mayonnaise, salad dressings, gravies and sauces, unless they are homemade with low-fat ingredients. - Limit chocolate. - Choose low-fat and nonfat products, such as low-fat mayonnaise, low-fat or non-hydrogenated peanut butter, low-fat or fat-free salad dressings and nonfat gravy. - Use vegetable oil, such as canola or olive oil. - Look for margarine that does not contain trans fatty acids. - Use nuts in moderate amounts. - Read ingredient labels carefully to determine both amount and type of fat present in foods. Limit saturated and trans fats! - Avoid high-fat processed and convenience foods.  Meats and Meat Alternatives - Choose fish, chicken, [place] and lean meats. - Use dried beans, peas, lentils and tofu. - Limit egg yolks to three to four per week. - If you eat red meat, limit to no more than three servings per week and choose loin or round cuts. - Avoid fatty meats, such as bacon, sausage, franks, luncheon meats and ribs. - Avoid all organ meats, including liver.  Dairy - Choose nonfat or low-fat milk, yogurt and cottage cheese. - Most cheeses are high in fat. Choose cheeses made from non-fat milk, such as mozzarella and ricotta cheese. - Choose light or fat-free cream cheese and sour cream. - Avoid cream and sauces made with cream.  Fruits and Vegetables - Eat a wide variety of fruits and vegetables. - Use lemon juice, vinegar or "mist" olive oil on vegetables. - Avoid adding sauces, fat or oil to  vegetables.  Breads, Cereals and Grains - Choose whole-grain breads, cereals, pastas and rice. - Avoid high-fat snack foods, such as granola, cookies, pies, pastries, doughnuts and croissants.  Cooking Tips - Avoid deep fried foods. - Trim visible fat off meats and remove skin from poultry before cooking. - Bake, broil, boil, poach or roast poultry, fish and lean meats. - Drain and discard fat that drains out of meat as you cook it. - Add little or no fat to foods. - Use vegetable oil sprays to grease pans for cooking or baking. - Steam vegetables. - Use herbs or no-oil marinades to flavor foods.    For those diagnosed with high triglycerides, it's important to  take action to lower your levels and improve your heart health.  Triglyceride is just a fancy word for fat - the fat in our bodies is stored in the form of triglycerides. Triglycerides are found in foods and manufactured in our bodies.  Normal triglyceride levels are defined as less than 150 mg/dL; 150 to 199 is considered borderline high; 200 to 499 is high; and 500 or higher is officially called very high. To me, anything over 150 is a red flag indicating my patient needs to take immediate steps to get the situation under control.   What is the significance of high triglycerides? High triglyceride levels make blood thicker and stickier, which means that it is more likely to form clots. Studies have shown that triglyceride levels are associated with increased risks of cardiovascular disease and stroke - in both men and women - alone or in combination with other risk factors (high triglycerides combined with high LDL cholesterol can be a particularly deadly combination). For example, in one ground-breaking study, high triglycerides alone increased the risk of cardiovascular disease by 14 percent in men, and by 62 percent in women. But when the test subjects also had low HDL cholesterol (that's the good cholesterol) and other risk  factors, high triglycerides increased the risk of disease by 32 percent in men and 76 percent in women.   Fortunately, triglycerides can sometimes be controlled with several diet and lifestyle changes.    What Factors Can Increase Triglycerides? As with cholesterol, eating too much of the wrong kinds of fats will raise your blood triglycerides.  Therefore, it's important to restrict the amounts of saturated fats and trans fats you allow into your diet.  Triglyceride levels can also shoot up after eating foods that are high in carbohydrates or after drinking alcohol.  That's why triglyceride blood tests require an overnight fast.  If you have elevated triglycerides, it's especially important to avoid sugary and refined carbohydrates, including sugar, honey, and other sweeteners, soda and other sugary drinks, candy, baked goods, and anything made with white (refined or enriched) flour, including white bread, rolls, cereals, buns, pastries, regular pasta, and white rice.  You'll also want to limit dried fruit and fruit juice since they're dense in simple sugar.  All of these low-quality carbs cause a sudden rise in insulin, which may lead to a spike in triglycerides.  Triglycerides can also become elevated as a reaction to having diabetes, hypothyroidism, or kidney disease. As with most other heart-related factors, being overweight and inactive also contribute to abnormal triglycerides. And unfortunately, some people have a genetic predisposition that causes them to manufacture way too much triglycerides on their own, no matter how carefully they eat.   How Can You Lower Your Triglyceride Levels? If you are diagnosed with high triglycerides, it's important to take action. There are several things you can do to help lower your triglyceride levels and improve your heart health:  Lose weight if you are overweight.  There is a clear correlation between obesity and high triglycerides - the heavier people are,  the higher their triglyceride levels are likely to be. The good news is that losing weight can significantly lower triglycerides. In a large study of individuals with type 2 diabetes, those assigned to the "lifestyle intervention group" - which involved counseling, a low-calorie meal plan, and customized exercise program - lost 8.6% of their body weight and lowered their triglyceride levels by more than 16%. If you're overweight, find a weight loss plan that works  for you and commit to shedding the pounds and getting healthier.  Reduce the amount of saturated fat and trans fat in your diet.  Start by avoiding or dramatically limiting butter, cream cheese, lard, sour cream, doughnuts, cakes, cookies, candy bars, regular ice cream, fried foods, pizza, cheese sauce, cream-based sauces and salad dressings, high-fat meats (including fatty hamburgers, bologna, pepperoni, sausage, bacon, salami, pastrami, spareribs, and hot dogs), high-fat cuts of beef and pork, and whole-milk dairy products.   Other ways to cut back: Choose lean meats only (including skinless chicken and [place], lean beef, lean pork), fish, and reduced-fat or fat-free dairy products.   Experiment with adding whole soy foods to your diet. Although soy itself may not reduce risk of heart disease, it replaces hazardous animal fats with healthier proteins. Choose high-quality soy foods, such as tofu, tempeh, soy milk, and edamame (whole soybeans).  Always remove skin from poultry.  Prepare foods by baking, roasting, broiling, boiling, poaching, steaming, grilling, or stir-frying in vegetable oil.  Most stick margarines contain trans fats, and trans fats are also found in some packaged baked goods, potato chips, snack foods, fried foods, and fast food that use or create hydrogenated oils.    (All food labels must now list the amount of trans fats, right after the amount of saturated fats - good news for consumers. As a result, many food companies  have now reformulated their products to be trans fat free.many, but not all! So it's still just as important to read labels and make sure the packaged foods you buy don't contain trans fats.)     If you use margarine, purchase soft-tub margarine spreads that contain 0 grams trans fats and don't list any partially hydrogenated oils in the ingredients list. By substituting olive oil or vegetable oil for trans fats in just 2 percent of your daily calories, you can reduce your risk of heart disease by 53 percent.   There is no safe amount of trans fats, so try to keep them as far from your plate as possible.  Avoid foods that are concentrated in sugar (even dried fruit and fruit juice). Sugary foods can elevate triglyceride levels in the blood, so keep them to a bare minimum.  Swap out refined carbohydrates for whole grains.  Refined carbohydrates - like white rice, regular pasta, and anything made with white or "enriched" flour (including white bread, rolls, cereals, buns, and crackers) - raise blood sugar and insulin levels more than fiber-rich whole grains. Higher insulin levels, in turn, can lead to a higher rise in triglycerides after a meal. So, make the switch to whole wheat bread, whole grain pasta, brown or wild rice, and whole grain versions of cereals, crackers, and other bread products. However, it's important to know that individuals with high triglycerides should moderate even their intake of high-quality starches (since all starches raise blood sugar) - I recommend 1 to 2 servings per meal.  Cut way back on alcohol.  If you have high triglycerides, alcohol should be considered a rare treat - if you indulge at all, since even small amounts of alcohol can dramatically increase triglyceride levels.  Incorporate omega-3 fats.  Heart-healthy fish oils are especially rich in omega-3 fatty acids. In multiple studies over the past two decades, people who ate diets high in omega-3s had 30 to 40  percent reductions in heart disease. Although we don't yet know why fish oil works so well, there are several possibilities. Omega-3s seem to reduce inflammation, reduce high  blood pressure, decrease triglycerides, raise HDL cholesterol, and make blood thinner and less sticky so it is less likely to clot. It's as close to a food prescription for heart health as it gets. If you have high triglycerides, I recommend eating at least three servings of one of the omega-3-rich fish every week (fatty fish is the most concentrated food form of omega three fats). If you cannot manage to eat that much fish, speak with your physician about taking fish oil capsules, which offer similar benefits.The best foods for omega-3 fatty acids include wild salmon (fresh, canned), herring, mackerel (not king), sardines, anchovies, rainbow trout, and Pacific oysters. Non-fish sources of omega-3 fats include omega-3-fortified eggs, ground flaxseed, chia seeds, walnuts, butternuts (white walnuts), seaweed, walnut oil, canola oil, and soybeans.  Quit smoking.  Smoking causes inflammation, not just in your lungs, but throughout your body. Inflammation can contribute to atherosclerosis, blood clots, and risk of heart attack. Smoking makes all heart health indicators worse. If you have high cholesterol, high triglycerides, or high blood pressure, smoking magnifies the danger.  Become more physically active.  Even moderate exercise can help improve cholesterol, triglycerides, and blood pressure. Aerobic exercise seems to be able to stop the sharp rise of triglycerides after eating, perhaps because of a decrease in the amount of triglyceride released by the liver, or because active muscle clears triglycerides out of the blood stream more quickly than inactive muscle. If you haven't exercised regularly (or at all) for years, I recommend starting slowly, by walking at an easy pace for 15 minutes a day. Then, as you feel more comfortable,  increase the amount. Your ultimate goal should be at least 30 minutes of moderate physical activity, at least five days a week.

## 2015-12-17 NOTE — Assessment & Plan Note (Signed)
About a 12 pk yr hx

## 2015-12-17 NOTE — Progress Notes (Signed)
Assessment and plan:  1. Hypertension, benign essential, goal below 140/90   2. Mixed hyperlipidemia   3. History of tobacco use disorder- quit 2/17   4. Hypokalemia- mild   5. Vitamin D deficiency   6. Subclinical hypothyroidism   7. Elevated LDL cholesterol level    1 and 4) - Blood pressure at goal. Continue medications. Patient understands that her thiazide diuretic could be contributing to her low potassium. She will eat at least one banana daily.    She declines changes to her medications today ( to chlorthalidone for instance) and was told to look up potassium rich foods and just try to eat more of those foods. Importance of stressed to pt.  Will reck  2)  10 year risk 3.2%.  Diet and lifestyle modification discussed and recommended. She would not benefit from statin or aspirin therapy.  Handouts given.   ( LDL may also be elevated due to elevated TSH)  Continue to control blood pressure  3) importance of tobacco abstinence discussed   -  Also you are not drinking enough water. You should be taking at least 80 ounces of water per day if you are not sweating or outside for any prolonged period of time.  5) Please take in at least 1200 mg of calcium per day to help with prevention of osteoporosis.  And please also take 5000 IUs daily Vit D3.    6) TSH elevated, T3 and T4-normal. Patient denies fatigue, weakness,hair loss, cold intolerance, muscle aches etc. today and would like to just recheck thyroid levels after discussion was had.   All questions were answered.  Reck-  TSH, T3 and Free T4, FLP, K level, vit D in about 4-98month.   She will f/up with her Psychiatrist for her Bi-polar d/o. Advised to review labs with them ( all other docs) as well.    Orders Placed This Encounter  Procedures  . Lipid panel    Standing Status:   Future    Standing Expiration Date:   01/02/2017    Order Specific Question:   Has  the patient fasted?    Answer:   Yes  . Vitamin D (25 hydroxy)    Standing Status:   Future    Standing Expiration Date:   01/02/2017  . TSH    Standing Status:   Future    Standing Expiration Date:   01/02/2017  . T4, free    Standing Status:   Future    Standing Expiration Date:   01/02/2017  . T3, free    Standing Status:   Future    Standing Expiration Date:   01/02/2017  . Potassium    Standing Status:   Future    Standing Expiration Date:   01/02/2017    New Prescriptions   No medications on file    Modified Medications   No medications on file    Discontinued Medications   No medications on file    Return in about 5 months (around 05/16/2016). Fasting bloodwrk and OV with me  Anticipatory guidance and routine counseling done re: condition, txmnt options and need for follow up. All questions of patient's were answered.   Gross side effects, risk and benefits, and alternatives of medications discussed with patient.  Patient is aware that all medications have potential side effects and we are unable to predict every sideeffect or drug-drug interaction that may occur.  Expresses verbal understanding and consents to current therapy  plan and treatment regiment.  Please see AVS handed out to patient at the end of our visit for additional patient instructions/ counseling done pertaining to today's office visit.  Note: This document was prepared using Dragon voice recognition software and may include unintentional dictation errors.   ----------------------------------------------------------------------------------------------------------------------  Subjective:   CC: Rebecca Espinoza is a 55 y.o. female who presents to [facility] today for review and discussion of recent bloodwork that was done.   All recent blood work that we ordered was reviewed with patient today.  Patient was counseled on all abnormalities and we discussed dietary and  lifestyle changes that could help those values (also medications when appropriate).  Extensive health counseling performed and all patient's concerns/ questions were addressed.   High blood pressure today above goal. Patient did not sleep well last night and emotions not well controlled lately.    Wt Readings from Last 3 Encounters:  12/25/15 151 lb 3.2 oz (68.6 kg)  12/17/15 151 lb 4.8 oz (68.6 kg)  11/30/15 152 lb 6.4 oz (69.1 kg)   BP Readings from Last 3 Encounters:  12/25/15 (!) 148/86  12/17/15 131/82  11/30/15 125/74   Pulse Readings from Last 3 Encounters:  12/17/15 80  11/30/15 78  06/30/15 71   BMI Readings from Last 3 Encounters:  12/25/15 25.16 kg/m  12/17/15 25.18 kg/m  11/30/15 25.36 kg/m     Full medical history updated and reviewed in the office today  Patient Active Problem List   Diagnosis Date Noted  . Mixed hyperlipidemia 11/30/2015    Priority: High  . Hypertension, benign essential, goal below 140/90 11/30/2015    Priority: High  . History of tobacco use disorder- quit 2/17 12/06/2015    Priority: Medium  . H/O abnormal cervical Papanicolaou smear 11/30/2015    Priority: Medium  . Family history of breast cancer in first degree relative 11/26/2015    Priority: Medium  . Hypokalemia- mild 01/03/2016  . Subclinical hypothyroidism 01/03/2016  . Status post hysterectomy 12/06/2015  . Bipolar I disorder, most recent episode mixed ([place]) 11/30/2015  . GERD (gastroesophageal reflux disease) 11/30/2015  . Psoriasiform dermatitis 11/30/2015  . History of breast augmentation 11/30/2015  . Chronic fatigue 11/30/2015  . Memory changes 11/30/2015  . Marijuana smoker, continuous ([place]) 11/30/2015  . Ache in joint 11/26/2015  . Gen Arthritis 11/26/2015  . Moderate episode of recurrent major depressive disorder ([place]) 11/26/2015  . Acute stress disorder 11/26/2015  . Vitamin D deficiency 11/26/2015    Past Medical History:  Diagnosis Date  . Acid  reflux   . Bipolar 1 disorder ([place])   . Depression   . Hyperlipidemia   . Hypertension   . Psoriasis     Past Surgical History:  Procedure Laterality Date  . ABDOMINAL HYSTERECTOMY    . BREAST ENHANCEMENT SURGERY    . RECTAL PROLAPSE REPAIR    . TONSILLECTOMY      Social History  Substance Use Topics  . Smoking status: Former Smoker    Quit date: 04/15/2015  . Smokeless tobacco: Never Used  . Alcohol use Yes     Comment: socially    family history includes Alcohol abuse in her father; Breast cancer in her mother; Depression in her father.   Medications: Current Outpatient Prescriptions  Medication Sig Dispense Refill  . buPROPion (WELLBUTRIN SR) 100 MG 12 hr tablet Take 100 mg by mouth 2 (two) times daily.    . hydrochlorothiazide (HYDRODIURIL) 25  MG tablet Take 25 mg by mouth daily.    Marland Kitchen lamoTRIgine (LAMICTAL) 25 MG tablet Take 75 mg by mouth at bedtime.     Marland Kitchen QUEtiapine (SEROQUEL) 100 MG tablet Take 100 mg by mouth at bedtime.    Marland Kitchen venlafaxine XR (EFFEXOR-XR) 75 MG 24 hr capsule Take 75 mg by mouth daily.     Marland Kitchen triamcinolone cream (KENALOG) 0.1 % Apply 1 application topically 2 (two) times daily. (Patient not taking: Reported on 12/17/2015) 453.6 g 0   No current facility-administered medications for this visit.     Allergies:  Allergies  Allergen Reactions  . Fish Allergy Nausea And Vomiting    White fish     ROS: Review of Systems  Constitutional: Negative.  Negative for chills, diaphoresis, fever, malaise/fatigue and weight loss.  HENT: Negative.  Negative for congestion, sore throat and tinnitus.   Eyes: Negative.  Negative for blurred vision, double vision and photophobia.  Respiratory: Negative.  Negative for cough and wheezing.   Cardiovascular: Negative.  Negative for chest pain and palpitations.  Gastrointestinal: Negative.  Negative for blood in stool, diarrhea, nausea and vomiting.  Genitourinary: Negative.  Negative for dysuria, frequency and  urgency.  Musculoskeletal: Negative.  Negative for joint pain and myalgias.  Skin: Negative.  Negative for itching and rash.  Neurological: Negative.  Negative for dizziness, focal weakness, weakness and headaches.  Endo/Heme/Allergies: Negative.  Negative for environmental allergies and polydipsia. Does not bruise/bleed easily.  Psychiatric/Behavioral: Negative.  Negative for depression and memory loss. The patient is not nervous/anxious and does not have insomnia.     Objective:  Blood pressure 131/82, pulse 80, height '5\' 5"'$  (1.651 m), weight 151 lb 4.8 oz (68.6 kg). Body mass index is 25.18 kg/m. Gen:   Well NAD, A and O *3 HEENT:    [place]/AT, EOMI,  MMM, OP- clr Lungs:   Normal work of breathing. CTA B/L, no Wh, rhonchi Heart:   RRR, S1, S2 WNL's, no MRG Abd:   No gross distention Exts:    warm, pink,  Brisk capillary refill, warm and well perfused.  Psych:    No HI/SI, judgement and insight good, Euthymic mood. Full Affect.   Recent Results (from the past 2160 hour(s))  CBC with Differential/Platelet     Status: None   Collection Time: 12/09/15  8:20 AM  Result Value Ref Range   WBC 5.5 3.8 - 10.8 K/uL   RBC 4.68 3.80 - 5.10 MIL/uL   Hemoglobin 13.8 11.7 - 15.5 g/dL   HCT 41.3 35.0 - 45.0 %   MCV 88.2 80.0 - 100.0 fL   MCH 29.5 27.0 - 33.0 pg   MCHC 33.4 32.0 - 36.0 g/dL   RDW 14.0 11.0 - 15.0 %   Platelets 164 140 - 400 K/uL   MPV 10.1 7.5 - 12.5 fL   Neutro Abs 3,520 1,500 - 7,800 cells/uL   Lymphs Abs 1,375 850 - 3,900 cells/uL   Monocytes Absolute 495 200 - 950 cells/uL   Eosinophils Absolute 110 15 - 500 cells/uL   Basophils Absolute 0 0 - 200 cells/uL   Neutrophils Relative % 64 %   Lymphocytes Relative 25 %   Monocytes Relative 9 %   Eosinophils Relative 2 %   Basophils Relative 0 %   Smear Review Criteria for review not met   COMPLETE METABOLIC PANEL WITH GFR     Status: Abnormal   Collection Time: 12/09/15  8:20 AM  Result Value Ref Range  Sodium 142 135 -  146 mmol/L   Potassium 3.3 (L) 3.5 - 5.3 mmol/L   Chloride 101 98 - 110 mmol/L   CO2 29 20 - 31 mmol/L   Glucose, Bld 89 65 - 99 mg/dL   BUN 17 7 - 25 mg/dL   Creat 1.00 0.50 - 1.05 mg/dL    Comment:   For patients > or = 57 years of age: The upper reference limit for Creatinine is approximately 13% higher for people identified as African-American.      Total Bilirubin 0.6 0.2 - 1.2 mg/dL   Alkaline Phosphatase 90 33 - 130 U/L   AST 18 10 - 35 U/L   ALT 10 6 - 29 U/L   Total Protein 6.6 6.1 - 8.1 g/dL   Albumin 4.1 3.6 - 5.1 g/dL   Calcium 8.9 8.6 - 10.4 mg/dL   GFR, Est African American 73 >=60 mL/min   GFR, Est Non African American 63 >=60 mL/min  Lipid panel     Status: Abnormal   Collection Time: 12/09/15  8:20 AM  Result Value Ref Range   Cholesterol 257 (H) 125 - 200 mg/dL   Triglycerides 124 <150 mg/dL   HDL 71 >=46 mg/dL   Total CHOL/HDL Ratio 3.6 <=5.0 Ratio   VLDL 25 <30 mg/dL   LDL Cholesterol 161 (H) <130 mg/dL    Comment:   Total Cholesterol/HDL Ratio:CHD Risk                        Coronary Heart Disease Risk Table                                        Men       Women          1/2 Average Risk              3.4        3.3              Average Risk              5.0        4.4           2X Average Risk              9.6        7.1           3X Average Risk             23.4       11.0 Use the calculated Patient Ratio above and the CHD Risk table  to determine the patient's CHD Risk.   Hemoglobin A1c     Status: None   Collection Time: 12/09/15  8:20 AM  Result Value Ref Range   Hgb A1c MFr Bld 5.0 <5.7 %    Comment:   For the purpose of screening for the presence of diabetes:   <5.7%       Consistent with the absence of diabetes 5.7-6.4 %   Consistent with increased risk for diabetes (prediabetes) >=6.5 %     Consistent with diabetes   This assay result is consistent with a decreased risk of diabetes.   Currently, no consensus exists regarding use of  hemoglobin A1c for diagnosis of diabetes in children.   According to American Diabetes Association (ADA) guidelines, hemoglobin A1c <  7.0% represents optimal control in non-pregnant diabetic patients. Different metrics may apply to specific patient populations. Standards of Medical Care in Diabetes (ADA).      Mean Plasma Glucose 97 mg/dL  T4, free     Status: None   Collection Time: 12/09/15  8:20 AM  Result Value Ref Range   Free T4 0.9 0.8 - 1.8 ng/dL  TSH     Status: Abnormal   Collection Time: 12/09/15  8:20 AM  Result Value Ref Range   TSH 6.92 (H) mIU/L    Comment:   Reference Range   > or = 20 Years  0.40-4.50   Pregnancy Range First trimester  0.26-2.66 Second trimester 0.55-2.73 Third trimester  0.43-2.91     VITAMIN D 25 Hydroxy (Vit-D Deficiency, Fractures)     Status: Abnormal   Collection Time: 12/09/15  8:20 AM  Result Value Ref Range   Vit D, 25-Hydroxy 22 (L) 30 - 100 ng/mL    Comment: Vitamin D Status           25-OH Vitamin D        Deficiency                <20 ng/mL        Insufficiency         20 - 29 ng/mL        Optimal             > or = 30 ng/mL   For 25-OH Vitamin D testing on patients on D2-supplementation and patients for whom quantitation of D2 and D3 fractions is required, the QuestAssureD 25-OH VIT D, (D2,D3), LC/MS/MS is recommended: order code [id number] (patients > 2 yrs).   Sedimentation rate     Status: None   Collection Time: 12/09/15  8:20 AM  Result Value Ref Range   Sed Rate 7 0 - 30 mm/hr  RPR     Status: None   Collection Time: 12/09/15  8:20 AM  Result Value Ref Range   RPR Ser Ql NON REAC NON REAC  T3, free     Status: None   Collection Time: 12/16/15  7:56 AM  Result Value Ref Range   T3, Free 3.0 2.3 - 4.2 pg/mL

## 2015-12-25 ENCOUNTER — Ambulatory Visit
Admission: RE | Admit: 2015-12-25 | Discharge: 2015-12-25 | Disposition: A | Discharge status: Home / Self Care | Payer: No Typology Code available for payment source | Source: Ambulatory Visit | Attending: Obstetrics and Gynecology | Admitting: Obstetrics and Gynecology

## 2015-12-25 ENCOUNTER — Ambulatory Visit (HOSPITAL_COMMUNITY): Payer: Medicaid Other

## 2015-12-25 ENCOUNTER — Ambulatory Visit (HOSPITAL_COMMUNITY)
Admission: RE | Admit: 2015-12-25 | Discharge: 2015-12-25 | Disposition: A | Discharge status: Home / Self Care | Payer: Self-pay | Source: Ambulatory Visit | Attending: Obstetrics and Gynecology | Admitting: Obstetrics and Gynecology

## 2015-12-25 ENCOUNTER — Telehealth: Payer: Self-pay

## 2015-12-25 ENCOUNTER — Encounter (HOSPITAL_COMMUNITY): Payer: Self-pay

## 2015-12-25 VITALS — BP 148/86 | Temp 98.9°F | Ht 65.0 in | Wt 151.2 lb

## 2015-12-25 DIAGNOSIS — Z1239 Encounter for other screening for malignant neoplasm of breast: Secondary | ICD-10-CM

## 2015-12-25 DIAGNOSIS — N6321 Unspecified lump in the left breast, upper outer quadrant: Secondary | ICD-10-CM

## 2015-12-25 DIAGNOSIS — N644 Mastodynia: Secondary | ICD-10-CM

## 2015-12-25 DIAGNOSIS — N6320 Unspecified lump in the left breast, unspecified quadrant: Secondary | ICD-10-CM

## 2015-12-25 NOTE — Patient Instructions (Signed)
Explained breast self awareness to [employer]. Patient did not need a Pap smear today due to her history of a hysterectomy for benign reasons. Asked patient to have previous Pap smear results faxed to [facility] to determine if she needs any further Pap smears due to her history of an abnormal Pap smear prior to her most recent Pap smear. Referred patient to the [facility] for diagnostic mammogram and possible left breast ultrasound. Appointment scheduled for Friday, December 25, 2015 at 1130. Atlee A Tolbert verbalized understanding.  Tylique Aull, Arvil Chaco, RN 2:11 PM

## 2015-12-25 NOTE — Progress Notes (Signed)
Complaints of left breast lump x 1-2 years that is painful at times. Patient rates pain at a 8 out of 10.  Pap Smear:  Pap smear not completed today. Last Pap smear was 10 years ago at Dr. Alan Ripper office and normal per patient. Per patient has a history of four abnormal Pap smears with the most recent one being prior to her Pap smear 10 years ago. Patient states she had three other abnormal Pap smears in her 20's that cryotherapy was completed for follow up. Patient has a history of a hysterectomy 04/27/1999 due to rectocele and cystocele. Asked patient to have a copy of her Pap smear results faxed to [facility] to determine if she needs any further Pap smears due to her history of abnormal Pap smears. No Pap smear results are in EPIC.  Physical exam: Breasts Breasts symmetrical. No skin abnormalities bilateral breasts. No nipple retraction bilateral breasts. No nipple discharge bilateral breasts. No lymphadenopathy. No lumps palpated right breast. Palpated a lump within the left breast at 2 o'clock 6 cm from the nipple. Complaints of left outer breast tenderness on exam. Referred patient to the [facility] for diagnostic mammogram and possible left breast ultrasound. Appointment scheduled for Friday, December 25, 2015 at 1130.        Pelvic/Bimanual No Pap smear completed today since patient has a history of a hysterectomy for benign reasons. Pap smear not indicated per [facility] guidelines.   Smoking History: Patient has never smoked.  Patient Navigation: Patient education provided. Access to services provided for patient through [facility] program.   Colorectal Cancer Screening: Per patient had a colonoscopy completed in 2011 and a FIT Test completed around April 2017. No complaints today.

## 2015-12-25 NOTE — Telephone Encounter (Signed)
Pt called questioning what does of Vitamin D she should be taking.  Advised pt that she needs to be taking 5000 IUs daily.  Pt expressed understanding and is agreeable.  Charyl Bigger, CMA

## 2015-12-29 ENCOUNTER — Encounter (HOSPITAL_COMMUNITY): Payer: Self-pay | Admitting: *Deleted

## 2016-01-03 DIAGNOSIS — E038 Other specified hypothyroidism: Secondary | ICD-10-CM | POA: Insufficient documentation

## 2016-01-03 DIAGNOSIS — E039 Hypothyroidism, unspecified: Secondary | ICD-10-CM | POA: Insufficient documentation

## 2016-01-03 DIAGNOSIS — E876 Hypokalemia: Secondary | ICD-10-CM | POA: Insufficient documentation

## 2016-03-04 ENCOUNTER — Emergency Department (HOSPITAL_COMMUNITY)
Admission: EM | Admit: 2016-03-04 | Discharge: 2016-03-04 | Disposition: A | Discharge status: Home / Self Care | Payer: Self-pay | Attending: Emergency Medicine | Admitting: Emergency Medicine

## 2016-03-04 ENCOUNTER — Emergency Department (HOSPITAL_COMMUNITY): Payer: Self-pay

## 2016-03-04 ENCOUNTER — Encounter (HOSPITAL_COMMUNITY): Payer: Self-pay | Admitting: Emergency Medicine

## 2016-03-04 DIAGNOSIS — Z79899 Other long term (current) drug therapy: Secondary | ICD-10-CM | POA: Insufficient documentation

## 2016-03-04 DIAGNOSIS — G8929 Other chronic pain: Secondary | ICD-10-CM | POA: Insufficient documentation

## 2016-03-04 DIAGNOSIS — X500XXA Overexertion from strenuous movement or load, initial encounter: Secondary | ICD-10-CM | POA: Insufficient documentation

## 2016-03-04 DIAGNOSIS — Y93B3 Activity, free weights: Secondary | ICD-10-CM | POA: Insufficient documentation

## 2016-03-04 DIAGNOSIS — Y999 Unspecified external cause status: Secondary | ICD-10-CM | POA: Insufficient documentation

## 2016-03-04 DIAGNOSIS — I10 Essential (primary) hypertension: Secondary | ICD-10-CM | POA: Insufficient documentation

## 2016-03-04 DIAGNOSIS — Z87891 Personal history of nicotine dependence: Secondary | ICD-10-CM | POA: Insufficient documentation

## 2016-03-04 DIAGNOSIS — M25522 Pain in left elbow: Secondary | ICD-10-CM | POA: Insufficient documentation

## 2016-03-04 DIAGNOSIS — Y929 Unspecified place or not applicable: Secondary | ICD-10-CM | POA: Insufficient documentation

## 2016-03-04 DIAGNOSIS — M25512 Pain in left shoulder: Secondary | ICD-10-CM | POA: Insufficient documentation

## 2016-03-04 MED ORDER — CYCLOBENZAPRINE HCL 10 MG PO TABS: 10.0000 mg | ORAL_TABLET | Freq: Two times a day (BID) | ORAL | 0 refills | Status: DC | PRN

## 2016-03-04 MED ORDER — IBUPROFEN 800 MG PO TABS
800.0000 mg | ORAL_TABLET | Freq: Three times a day (TID) | ORAL | 0 refills | Status: DC
Start: 2016-03-04 — End: 2017-03-21

## 2016-03-04 NOTE — Discharge Instructions (Signed)
Medications: ibuprofen, Flexeril  Treatment: Take ibuprofen every 8 hours for your pain and swelling. You can take Flexeril twice daily as needed for your muscle pain and spasms. Do not drive or operate machinery when taking this medication. Use ice 3-4 times daily alternating 20 minutes on, 20 minutes off. Wear your sling as needed for comfort, however take your arm out and do the range of motion exercises that we discussed 2-3 times daily.  Follow-up: Please follow-up with the orthopedic doctor for further evaluation and treatment of your shoulder and elbow pain. Please return to emergency department if you develop any new or worsening symptoms.

## 2016-03-04 NOTE — ED Triage Notes (Signed)
Pt reports L elbow pain for the past year. Found a knot on her upper arm yesterday. Worked out on [employer] lifting equipment on Tuesday and has had increased pain in entire arm since then.

## 2016-03-04 NOTE — ED Provider Notes (Signed)
[facility] DEPT Provider Note   CSN: [id number] Arrival date & time: 03/04/16  1045  By signing my name below, I, Soijett Blue, attest that this documentation has been prepared under the direction and in the presence of Eliezer Mccoy, PA-C Electronically Signed: Soijett Blue, ED Scribe. 03/04/16. 12:19 PM.  History   Chief Complaint Chief Complaint  Patient presents with  . Arm Pain    HPI Rebecca Espinoza is a 55 y.o. female with a PMx of HTN, who presents to the Emergency Department complaining of gradually worsening left elbow pain 3 days ago. Pt notes that she was lifting weights prior to the onset of her symptoms. Pt is having associated symptoms of left shoulder pain and knot to left upper arm x 1 year, left elbow pain, and left elbow swelling x 2 days. Patient has been having shoulder pain 1 year but has no known injury. 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 She denies color change, wound, CP, SOB, abdominal pain, nausea, vomiting, fever, chills, and any other symptoms. Denies having insurance or PCP at this time.   The history is provided by the patient. No language interpreter was used.    Past Medical History:  Diagnosis Date  . Acid reflux   . Bipolar 1 disorder ([place])   . Depression   . Hyperlipidemia   . Hypertension   . Psoriasis     Patient Active Problem List   Diagnosis Date Noted  . Hypokalemia- mild 01/03/2016  . Subclinical hypothyroidism 01/03/2016  . History of tobacco use disorder- quit 2/17 12/06/2015  . Status post hysterectomy 12/06/2015  . Bipolar I disorder, most recent episode mixed ([place]) 11/30/2015  . GERD (gastroesophageal reflux disease) 11/30/2015  . Psoriasiform dermatitis 11/30/2015  . Mixed hyperlipidemia 11/30/2015  . History of breast augmentation 11/30/2015  . Chronic fatigue 11/30/2015  . Hypertension, benign essential, goal below  140/90 11/30/2015  . H/O abnormal cervical Papanicolaou smear 11/30/2015  . Memory changes 11/30/2015  . Marijuana smoker, continuous ([place]) 11/30/2015  . Ache in joint 11/26/2015  . Gen Arthritis 11/26/2015  . Family history of breast cancer in first degree relative 11/26/2015  . Moderate episode of recurrent major depressive disorder ([place]) 11/26/2015  . Acute stress disorder 11/26/2015  . Vitamin D deficiency 11/26/2015    Past Surgical History:  Procedure Laterality Date  . ABDOMINAL HYSTERECTOMY    . BREAST ENHANCEMENT SURGERY    . RECTAL PROLAPSE REPAIR    . TONSILLECTOMY      OB History    Gravida Para Term Preterm AB Living   5 4 4   1 4    SAB TAB Ectopic Multiple Live Births   1       4       Home Medications    Prior to Admission medications   Medication Sig Start Date End Date Taking? Authorizing Provider  buPROPion (WELLBUTRIN SR) 100 MG 12 hr tablet Take 100 mg by mouth 2 (two) times daily.    Historical Provider, MD  cyclobenzaprine (FLEXERIL) 10 MG tablet Take 1 tablet (10 mg total) by mouth 2 (two) times daily as needed for muscle spasms. 03/04/16   Frederica Kuster, PA-C  hydrochlorothiazide (HYDRODIURIL) 25 MG tablet Take 25 mg by mouth daily.    Historical Provider, MD  ibuprofen (ADVIL,MOTRIN) 800 MG tablet Take 1 tablet (800 mg total) by mouth 3 (three) times daily.  03/04/16   Frederica Kuster, PA-C  lamoTRIgine (LAMICTAL) 25 MG tablet Take 75 mg by mouth at bedtime.     Historical Provider, MD  QUEtiapine (SEROQUEL) 100 MG tablet Take 100 mg by mouth at bedtime.    Historical Provider, MD  triamcinolone cream (KENALOG) 0.1 % Apply 1 application topically 2 (two) times daily. Patient not taking: Reported on 12/17/2015 12/11/15   Mellody Dance, DO  venlafaxine XR (EFFEXOR-XR) 75 MG 24 hr capsule Take 75 mg by mouth daily.     Historical Provider, MD    Family History Family History  Problem Relation Age of Onset  . Breast cancer Mother   . Alcohol abuse  Father   . Depression Father     Social History Social History  Substance Use Topics  . Smoking status: Former Smoker    Quit date: 04/15/2015  . Smokeless tobacco: Never Used  . Alcohol use Yes     Comment: socially     Allergies   Fish allergy   Review of Systems Review of Systems  Constitutional: Negative for chills and fever.  Respiratory: Negative for shortness of breath.   Cardiovascular: Negative for chest pain.  Gastrointestinal: Negative for abdominal pain, nausea and vomiting.  Musculoskeletal: Positive for arthralgias (left shoulder and left elbow).  Skin: Negative for color change and wound.       Knot to left upper arm     Physical Exam Updated Vital Signs BP 150/86   Pulse 78   Temp 98 F (36.7 C) (Oral)   Resp 16   SpO2 100%   Physical Exam  Constitutional: She appears well-developed and well-nourished. No distress.  HENT:  Head: Normocephalic and atraumatic.  Mouth/Throat: Oropharynx is clear and moist. No oropharyngeal exudate.  Eyes: Conjunctivae are normal. Pupils are equal, round, and reactive to light. Right eye exhibits no discharge. Left eye exhibits no discharge. No scleral icterus.  Neck: Normal range of motion. Neck supple. No thyromegaly present.  Cardiovascular: Normal rate, regular rhythm, normal heart sounds and intact distal pulses.  Exam reveals no gallop and no friction rub.   No murmur heard. Pulmonary/Chest: Effort normal and breath sounds normal. No stridor. No respiratory distress. She has no wheezes. She has no rales.  Abdominal: Soft. Bowel sounds are normal. She exhibits no distension. There is no tenderness. There is no rebound and no guarding.  Musculoskeletal: She exhibits no edema.       Left elbow: She exhibits normal range of motion and no deformity. Tenderness found. Medial epicondyle and lateral epicondyle tenderness noted.       Left upper arm: She exhibits tenderness.  Left arm: Tenderness over deltoid and posterior  shoulder. Positive empty can test. Left elbow: Tenderness and edema over medial and lateral epicondyle. No deformity noted. Biceps tendon intact. FROM with pain with flexion and extension.  Normal sensation, cap refill <2secs, equal bilateral grip strength to upper extremities  Lymphadenopathy:    She has no cervical adenopathy.  Neurological: She is alert. Coordination normal.  Skin: Skin is warm and dry. No rash noted. She is not diaphoretic. No pallor.  Psychiatric: She has a normal mood and affect.  Nursing note and vitals reviewed.   ED Treatments / Results  DIAGNOSTIC STUDIES: Oxygen Saturation is 100% on RA, nl by my interpretation.    COORDINATION OF CARE: 11:57 AM Discussed treatment plan with pt at bedside which includes left shoulder xray, left elbow xray, ice, anti-inflammatory Rx, referral and follow up with  orthopedist, and pt agreed to plan.   Radiology Dg Elbow Complete Left  Result Date: 03/04/2016 CLINICAL DATA:  Generalized lt shoulder, lt elbow, lt arm pain worsening since lifting weights yesterday, could not fully straighten arm for elbow images, states lt shoulder has been hurting for approx 9yr, worse today EXAM: LEFT ELBOW - COMPLETE 3+ VIEW COMPARISON:  None. FINDINGS: There is no evidence of fracture, dislocation, or joint effusion. There is no evidence of arthropathy or other focal bone abnormality. Soft tissues are unremarkable. IMPRESSION: Negative. Electronically Signed   By: Lucrezia Europe M.D.   On: 03/04/2016 11:57   Dg Shoulder Left  Result Date: 03/04/2016 CLINICAL DATA:  Left shoulder pain since lifting weights yesterday. EXAM: LEFT SHOULDER - 2+ VIEW COMPARISON:  None. FINDINGS: There is a relatively well corticated calcific fragment chest superior to the humeral head. There is no evidence of shoulder dislocation. Soft tissues are normal. IMPRESSION: Relatively well corticated calcific fragment superior to the left humeral head, which likely represents an  age-indeterminate, probably chronic, avulsion fracture with uncertain donor site. No evidence of dislocation. Electronically Signed   By: Fidela Salisbury M.D.   On: 03/04/2016 12:52    Procedures Procedures (including critical care time)  Medications Ordered in ED Medications - No data to display   Initial Impression / Assessment and Plan / ED Course  I have reviewed the triage vital signs and the nursing notes.  Pertinent imaging results that were available during my care of the patient were reviewed by me and considered in my medical decision making (see chart for details).  Clinical Course     X-ray of left elbow negative.X-ray of left shoulder shows relatively well corticated calcific fragment superior to the left humeral head, which likely represents an age indeterminate, probably chronic, avulsion fracture with uncertain donor site; no evidence of dislocation.No signs of septic joint, most likely overuse injury of the elbow. Probable pain and shoulder due to avulsion fracture, or possibly rotator cuff injury. We'll place patient in shoulder sling, discharged with NSAIDs, and follow-up to orthopedics.Supportive treatment discussed including range of motion exercises to elbow and ice therapy. Return precautions discussed. Patient understands and agrees with plan. Patient vitals stable throughout ED course and discharged in satisfactory condition. I discussed patient case with Dr. Roderic Palau who guided the patient's management and agrees with plan.  Final Clinical Impressions(s) / ED Diagnoses   Final diagnoses:  Left elbow pain  Chronic left shoulder pain    New Prescriptions Discharge Medication List as of 03/04/2016  1:04 PM    START taking these medications   Details  cyclobenzaprine (FLEXERIL) 10 MG tablet Take 1 tablet (10 mg total) by mouth 2 (two) times daily as needed for muscle spasms., Starting Fri 03/04/2016, Print    ibuprofen (ADVIL,MOTRIN) 800 MG tablet Take 1  tablet (800 mg total) by mouth 3 (three) times daily., Starting Fri 03/04/2016, Print       I personally performed the services described in this documentation, which was scribed in my presence. The recorded information has been reviewed and is accurate.     Frederica Kuster, PA-C 03/04/16 1432    Milton Ferguson, MD 03/05/16 [phone]

## 2016-05-24 ENCOUNTER — Ambulatory Visit: Payer: Medicaid Other | Admitting: Family Medicine

## 2016-07-09 ENCOUNTER — Emergency Department (HOSPITAL_COMMUNITY)
Admission: EM | Admit: 2016-07-09 | Discharge: 2016-07-09 | Disposition: A | Discharge status: Home / Self Care | Payer: Self-pay | Attending: Emergency Medicine | Admitting: Emergency Medicine

## 2016-07-09 ENCOUNTER — Encounter (HOSPITAL_COMMUNITY): Payer: Self-pay | Admitting: Emergency Medicine

## 2016-07-09 ENCOUNTER — Emergency Department (HOSPITAL_COMMUNITY): Payer: Self-pay

## 2016-07-09 DIAGNOSIS — R1031 Right lower quadrant pain: Secondary | ICD-10-CM | POA: Insufficient documentation

## 2016-07-09 DIAGNOSIS — R1030 Lower abdominal pain, unspecified: Secondary | ICD-10-CM

## 2016-07-09 DIAGNOSIS — Z79899 Other long term (current) drug therapy: Secondary | ICD-10-CM | POA: Insufficient documentation

## 2016-07-09 DIAGNOSIS — I10 Essential (primary) hypertension: Secondary | ICD-10-CM | POA: Insufficient documentation

## 2016-07-09 DIAGNOSIS — Z87891 Personal history of nicotine dependence: Secondary | ICD-10-CM | POA: Insufficient documentation

## 2016-07-09 LAB — URINALYSIS, ROUTINE W REFLEX MICROSCOPIC
Bacteria, UA: NONE SEEN
Bilirubin Urine: NEGATIVE
GLUCOSE, UA: NEGATIVE mg/dL
Ketones, ur: NEGATIVE mg/dL
LEUKOCYTES UA: NEGATIVE
Nitrite: NEGATIVE
PH: 5 (ref 5.0–8.0)
Protein, ur: NEGATIVE mg/dL
SPECIFIC GRAVITY, URINE: 1.018 (ref 1.005–1.030)

## 2016-07-09 LAB — COMPREHENSIVE METABOLIC PANEL
ALBUMIN: 4.5 g/dL (ref 3.5–5.0)
ALK PHOS: 96 U/L (ref 38–126)
ALT: 14 U/L (ref 14–54)
ANION GAP: 10 (ref 5–15)
AST: 18 U/L (ref 15–41)
BILIRUBIN TOTAL: 0.6 mg/dL (ref 0.3–1.2)
BUN: 13 mg/dL (ref 6–20)
CALCIUM: 9.7 mg/dL (ref 8.9–10.3)
CO2: 29 mmol/L (ref 22–32)
CREATININE: 1.05 mg/dL — AB (ref 0.44–1.00)
Chloride: 99 mmol/L — ABNORMAL LOW (ref 101–111)
GFR, EST NON AFRICAN AMERICAN: 58 mL/min — AB (ref >60)
Glucose, Bld: 106 mg/dL — ABNORMAL HIGH (ref 65–99)
POTASSIUM: 3 mmol/L — AB (ref 3.5–5.1)
Sodium: 138 mmol/L (ref 135–145)
Total Protein: 8.1 g/dL (ref 6.5–8.1)

## 2016-07-09 LAB — CBC
HCT: 41.8 % (ref 36.0–46.0)
Hemoglobin: 14.3 g/dL (ref 12.0–15.0)
MCH: 30.1 pg (ref 26.0–34.0)
MCHC: 34.2 g/dL (ref 30.0–36.0)
MCV: 88 fL (ref 78.0–100.0)
PLATELETS: 202 10*3/uL (ref 150–400)
RBC: 4.75 MIL/uL (ref 3.87–5.11)
RDW: 13.3 % (ref 11.5–15.5)
WBC: 7.1 10*3/uL (ref 4.0–10.5)

## 2016-07-09 LAB — LIPASE, BLOOD: Lipase: 25 U/L (ref 11–51)

## 2016-07-09 MED ORDER — ONDANSETRON HCL 4 MG/2ML IJ SOLN
4.0000 mg | Freq: Once | INTRAMUSCULAR | Status: AC
  Administered 2016-07-09: 4 mg via INTRAVENOUS
  Filled 2016-07-09: qty 2

## 2016-07-09 MED ORDER — HYDROMORPHONE HCL 1 MG/ML IJ SOLN
0.5000 mg | Freq: Once | INTRAMUSCULAR | Status: AC
  Administered 2016-07-09: 0.5 mg via INTRAVENOUS
  Filled 2016-07-09: qty 1

## 2016-07-09 MED ORDER — SODIUM CHLORIDE 0.9 % IV BOLUS (SEPSIS)
1000.0000 mL | Freq: Once | INTRAVENOUS | Status: AC
  Administered 2016-07-09: 1000 mL via INTRAVENOUS

## 2016-07-09 MED ORDER — IOPAMIDOL (ISOVUE-300) INJECTION 61%
INTRAVENOUS | Status: AC
  Administered 2016-07-09: 100 mL via INTRAVENOUS
  Filled 2016-07-09: qty 100

## 2016-07-09 NOTE — ED Triage Notes (Signed)
Pt from home c/o sudden, RLQ pain that started today. Pt c/o mild pain, nausea and diarrhea for the past two days. Pt is concerned that her "hernia has ruptured." Pt denies dysuria. Pt is A&O and in NAD

## 2016-07-09 NOTE — ED Provider Notes (Signed)
[facility] DEPT Provider Note   CSN: [id number] Arrival date & time: 07/09/16  1802     History   Chief Complaint Chief Complaint  Patient presents with  . Abdominal Pain    HPI Rebecca Espinoza is a 56 y.o. female.  Patient complains of some right lower quadrant pain. Patient states she has a history of a hernia down there. So fever no chills   The history is provided by the patient. No language interpreter was used.  Abdominal Pain   This is a recurrent problem. The current episode started 12 to 24 hours ago. The problem occurs constantly. The problem has not changed since onset.The pain is associated with an unknown factor. The pain is located in the RLQ. The quality of the pain is aching. The pain is at a severity of 3/10. The pain is mild. Pertinent negatives include diarrhea, frequency, hematuria and headaches.    Past Medical History:  Diagnosis Date  . Acid reflux   . Bipolar 1 disorder ([place])   . Depression   . Hyperlipidemia   . Hypertension   . Psoriasis     Patient Active Problem List   Diagnosis Date Noted  . Hypokalemia- mild 01/03/2016  . Subclinical hypothyroidism 01/03/2016  . History of tobacco use disorder- quit 2/17 12/06/2015  . Status post hysterectomy 12/06/2015  . Bipolar I disorder, most recent episode mixed ([place]) 11/30/2015  . GERD (gastroesophageal reflux disease) 11/30/2015  . Psoriasiform dermatitis 11/30/2015  . Mixed hyperlipidemia 11/30/2015  . History of breast augmentation 11/30/2015  . Chronic fatigue 11/30/2015  . Hypertension, benign essential, goal below 140/90 11/30/2015  . H/O abnormal cervical Papanicolaou smear 11/30/2015  . Memory changes 11/30/2015  . Marijuana smoker, continuous 11/30/2015  . Ache in joint 11/26/2015  . Gen Arthritis 11/26/2015  . Family history of breast cancer in first degree relative 11/26/2015  . Moderate episode of recurrent major depressive disorder ([place]) 11/26/2015  . Acute stress disorder  11/26/2015  . Vitamin D deficiency 11/26/2015    Past Surgical History:  Procedure Laterality Date  . ABDOMINAL HYSTERECTOMY    . BREAST ENHANCEMENT SURGERY    . RECTAL PROLAPSE REPAIR    . TONSILLECTOMY      OB History    Gravida Para Term Preterm AB Living   5 4 4   1 4    SAB TAB Ectopic Multiple Live Births   1       4       Home Medications    Prior to Admission medications   Medication Sig Start Date End Date Taking? Authorizing Provider  BuPROPion HCl (WELLBUTRIN XL PO) Take 1 tablet by mouth daily. Filled at [facility]; pt unsure of strength, just knew name and once daily dosing   Yes Historical Provider, MD  Calcium Carbonate-Vit D-Min (CALCIUM 1200 PO) Take 2,400 mg by mouth.   Yes Historical Provider, MD  Cholecalciferol (VITAMIN D3) 5000 units CAPS Take 5,000 Units by mouth.   Yes Historical Provider, MD  hydrochlorothiazide (HYDRODIURIL) 25 MG tablet Take 25 mg by mouth daily.   Yes Historical Provider, MD  lamoTRIgine (LAMICTAL) 25 MG tablet Take 75 mg by mouth at bedtime.    Yes Historical Provider, MD  LISINOPRIL PO Take 1 tablet by mouth daily. Patient unsure of strength, but gets filled at [facility] listed on file   Yes Historical Provider, MD  [place] 1 application topically daily. Could not remember name, but has filled at [facility] for Psoriasis.  Yes Historical Provider, MD  QUEtiapine (SEROQUEL) 100 MG tablet Take 100 mg by mouth at bedtime.   Yes Historical Provider, MD  cyclobenzaprine (FLEXERIL) 10 MG tablet Take 1 tablet (10 mg total) by mouth 2 (two) times daily as needed for muscle spasms. Patient not taking: Reported on 07/09/2016 03/04/16   Frederica Kuster, PA-C  ibuprofen (ADVIL,MOTRIN) 800 MG tablet Take 1 tablet (800 mg total) by mouth 3 (three) times daily. Patient not taking: Reported on 07/09/2016 03/04/16   Frederica Kuster, PA-C  triamcinolone cream (KENALOG) 0.1 % Apply 1 application topically 2 (two) times daily. Patient not  taking: Reported on 12/17/2015 12/11/15   Mellody Dance, DO    Family History Family History  Problem Relation Age of Onset  . Breast cancer Mother   . Alcohol abuse Father   . Depression Father     Social History Social History  Substance Use Topics  . Smoking status: Former Smoker    Quit date: 04/15/2015  . Smokeless tobacco: Never Used  . Alcohol use Yes     Comment: socially     Allergies   Fish allergy   Review of Systems Review of Systems  Constitutional: Negative for appetite change and fatigue.  HENT: Negative for congestion, ear discharge and sinus pressure.   Eyes: Negative for discharge.  Respiratory: Negative for cough.   Cardiovascular: Negative for chest pain.  Gastrointestinal: Positive for abdominal pain. Negative for diarrhea.  Genitourinary: Negative for frequency and hematuria.  Musculoskeletal: Negative for back pain.  Skin: Negative for rash.  Neurological: Negative for seizures and headaches.  Psychiatric/Behavioral: Negative for hallucinations.     Physical Exam Updated Vital Signs BP 140/84 (BP Location: Left Arm)   Pulse 79   Temp 98.5 F (36.9 C) (Oral)   Resp 16   Ht 5\' 5"  (1.651 m)   Wt 145 lb (65.8 kg)   SpO2 97%   BMI 24.13 kg/m   Physical Exam  Constitutional: She is oriented to person, place, and time. She appears well-developed.  HENT:  Head: Normocephalic.  Eyes: Conjunctivae and EOM are normal. No scleral icterus.  Neck: Neck supple. No thyromegaly present.  Cardiovascular: Normal rate and regular rhythm.  Exam reveals no gallop and no friction rub.   No murmur heard. Pulmonary/Chest: No stridor. She has no wheezes. She has no rales. She exhibits no tenderness.  Abdominal: She exhibits no distension. There is tenderness. There is no rebound.  Mild right lower quadrant tenderness  Musculoskeletal: Normal range of motion. She exhibits no edema.  Lymphadenopathy:    She has no cervical adenopathy.  Neurological: She  is oriented to person, place, and time. She exhibits normal muscle tone. Coordination normal.  Skin: No rash noted. No erythema.  Psychiatric: She has a normal mood and affect. Her behavior is normal.     ED Treatments / Results  Labs (all labs ordered are listed, but only abnormal results are displayed) Labs Reviewed  COMPREHENSIVE METABOLIC PANEL - Abnormal; Notable for the following:       Result Value   Potassium 3.0 (*)    Chloride 99 (*)    Glucose, Bld 106 (*)    Creatinine, Ser 1.05 (*)    GFR calc non Af Amer 58 (*)    All other components within normal limits  URINALYSIS, ROUTINE W REFLEX MICROSCOPIC - Abnormal; Notable for the following:    Hgb urine dipstick MODERATE (*)    Squamous Epithelial / LPF 0-5 (*)  All other components within normal limits  LIPASE, BLOOD  CBC    EKG  EKG Interpretation None       Radiology Ct Abdomen Pelvis W Contrast  Result Date: 07/09/2016 CLINICAL DATA:  Right lower quadrant pain.  Nausea and diarrhea. EXAM: CT ABDOMEN AND PELVIS WITH CONTRAST TECHNIQUE: Multidetector CT imaging of the abdomen and pelvis was performed using the standard protocol following bolus administration of intravenous contrast. CONTRAST:  <See Chart> ISOVUE-300 IOPAMIDOL (ISOVUE-300) INJECTION 61% COMPARISON:  December 25, 2010 FINDINGS: Lower chest: No acute abnormality. Hepatobiliary: No focal liver abnormality is seen. No gallstones, gallbladder wall thickening, or biliary dilatation. Pancreas: Unremarkable. No pancreatic ductal dilatation or surrounding inflammatory changes. Spleen: Normal in size without focal abnormality. Adrenals/Urinary Tract: Rounded low-attenuation lesions in the left kidney are almost certainly cysts but too small to characterize. The kidneys, ureters, bladder, and adrenal glands are otherwise normal. Stomach/Bowel: The stomach and small bowel are normal. Scattered colonic diverticuli are seen without diverticulitis. The colon is  otherwise normal. The appendix is unremarkable. Vascular/Lymphatic: Mild atherosclerosis in the non aneurysmal abdominal aorta. No adenopathy. Reproductive: Status post hysterectomy. No adnexal masses. Other: No abdominal wall hernia or abnormality. No abdominopelvic ascites. Musculoskeletal: No acute or significant osseous findings. IMPRESSION: 1. No acute abnormalities identified. The appendix is normal in appearance. 2. Mild atherosclerosis in the non aneurysmal aorta. 3. No other acute abnormalities. Electronically Signed   By: Dorise Bullion III M.D   On: 07/09/2016 20:01    Procedures Procedures (including critical care time)  Medications Ordered in ED Medications  HYDROmorphone (DILAUDID) injection 0.5 mg (0.5 mg Intravenous Given 07/09/16 2030)  ondansetron (ZOFRAN) injection 4 mg (4 mg Intravenous Given 07/09/16 2030)  sodium chloride 0.9 % bolus 1,000 mL (0 mLs Intravenous Stopped 07/09/16 2100)  iopamidol (ISOVUE-300) 61 % injection (100 mLs Intravenous Contrast Given 07/09/16 1935)     Initial Impression / Assessment and Plan / ED Course  I have reviewed the triage vital signs and the nursing notes.  Pertinent labs & imaging results that were available during my care of the patient were reviewed by me and considered in my medical decision making (see chart for details).     Labs and CT scan unremarkable. Patient with minimal abdominal pain possibly related to old hernia that she has. She will follow-up with her PCP  Final Clinical Impressions(s) / ED Diagnoses   Final diagnoses:  Lower abdominal pain    New Prescriptions New Prescriptions   No medications on file     Milton Ferguson, MD 07/09/16 2239

## 2016-07-09 NOTE — Discharge Instructions (Signed)
Follow up with your md if any problems °

## 2017-01-09 ENCOUNTER — Emergency Department (HOSPITAL_COMMUNITY)
Admission: EM | Admit: 2017-01-09 | Discharge: 2017-01-09 | Disposition: A | Discharge status: Home / Self Care | Payer: Self-pay | Attending: Emergency Medicine | Admitting: Emergency Medicine

## 2017-01-09 ENCOUNTER — Encounter (HOSPITAL_COMMUNITY): Payer: Self-pay

## 2017-01-09 ENCOUNTER — Emergency Department (HOSPITAL_COMMUNITY): Payer: Self-pay

## 2017-01-09 DIAGNOSIS — I10 Essential (primary) hypertension: Secondary | ICD-10-CM | POA: Insufficient documentation

## 2017-01-09 DIAGNOSIS — S63502A Unspecified sprain of left wrist, initial encounter: Secondary | ICD-10-CM

## 2017-01-09 DIAGNOSIS — Y999 Unspecified external cause status: Secondary | ICD-10-CM | POA: Insufficient documentation

## 2017-01-09 DIAGNOSIS — Z79899 Other long term (current) drug therapy: Secondary | ICD-10-CM | POA: Insufficient documentation

## 2017-01-09 DIAGNOSIS — S63501A Unspecified sprain of right wrist, initial encounter: Secondary | ICD-10-CM

## 2017-01-09 DIAGNOSIS — Y939 Activity, unspecified: Secondary | ICD-10-CM | POA: Insufficient documentation

## 2017-01-09 DIAGNOSIS — W19XXXA Unspecified fall, initial encounter: Secondary | ICD-10-CM | POA: Insufficient documentation

## 2017-01-09 DIAGNOSIS — Y929 Unspecified place or not applicable: Secondary | ICD-10-CM | POA: Insufficient documentation

## 2017-01-09 DIAGNOSIS — F17210 Nicotine dependence, cigarettes, uncomplicated: Secondary | ICD-10-CM | POA: Insufficient documentation

## 2017-01-09 NOTE — ED Provider Notes (Signed)
[facility] DEPT Provider Note   CSN: [id number] Arrival date & time: 01/09/17  1005     History   Chief Complaint Chief Complaint  Patient presents with  . Fall  . Wrist Injury    HPI Rebecca Espinoza is a 56 y.o. female.  Pt comes in with c/o left wrist pain after a fall 2 days ago.denies numbness or weakness. States that she wrapped it and it helped a little but it is still swollen so she wanted to make sure that it wasn't broken. Denies previous injury      Past Medical History:  Diagnosis Date  . Acid reflux   . Bipolar 1 disorder ([place])   . Depression   . Hyperlipidemia   . Hypertension   . Psoriasis     Patient Active Problem List   Diagnosis Date Noted  . Hypokalemia- mild 01/03/2016  . Subclinical hypothyroidism 01/03/2016  . History of tobacco use disorder- quit 2/17 12/06/2015  . Status post hysterectomy 12/06/2015  . Bipolar I disorder, most recent episode mixed ([place]) 11/30/2015  . GERD (gastroesophageal reflux disease) 11/30/2015  . Psoriasiform dermatitis 11/30/2015  . Mixed hyperlipidemia 11/30/2015  . History of breast augmentation 11/30/2015  . Chronic fatigue 11/30/2015  . Hypertension, benign essential, goal below 140/90 11/30/2015  . H/O abnormal cervical Papanicolaou smear 11/30/2015  . Memory changes 11/30/2015  . Marijuana smoker, continuous 11/30/2015  . Ache in joint 11/26/2015  . Gen Arthritis 11/26/2015  . Family history of breast cancer in first degree relative 11/26/2015  . Moderate episode of recurrent major depressive disorder ([place]) 11/26/2015  . Acute stress disorder 11/26/2015  . Vitamin D deficiency 11/26/2015    Past Surgical History:  Procedure Laterality Date  . ABDOMINAL HYSTERECTOMY    . BLADDER REPAIR    . BREAST ENHANCEMENT SURGERY    . RECTAL PROLAPSE REPAIR    . TONSILLECTOMY      OB History    Gravida Para Term Preterm AB Living   5 4 4   1 4    SAB TAB Ectopic Multiple Live  Births   1       4       Home Medications    Prior to Admission medications   Medication Sig Start Date End Date Taking? Authorizing Provider  BuPROPion HCl (WELLBUTRIN XL PO) Take 1 tablet by mouth daily. Filled at [facility]; pt unsure of strength, just knew name and once daily dosing    [provider]  Calcium Carbonate-Vit D-Min (CALCIUM 1200 PO) Take 2,400 mg by mouth.    [provider]  Cholecalciferol (VITAMIN D3) 5000 units CAPS Take 5,000 Units by mouth.    [provider]  cyclobenzaprine (FLEXERIL) 10 MG tablet Take 1 tablet (10 mg total) by mouth 2 (two) times daily as needed for muscle spasms. Patient not taking: Reported on 07/09/2016 03/04/16   Frederica Kuster, PA-C  hydrochlorothiazide (HYDRODIURIL) 25 MG tablet Take 25 mg by mouth daily.    [provider]  ibuprofen (ADVIL,MOTRIN) 800 MG tablet Take 1 tablet (800 mg total) by mouth 3 (three) times daily. Patient not taking: Reported on 07/09/2016 03/04/16   Frederica Kuster, PA-C  lamoTRIgine (LAMICTAL) 25 MG tablet Take 75 mg by mouth at bedtime.     [provider]  LISINOPRIL PO Take 1 tablet by mouth daily. Patient unsure of strength, but gets filled at [facility] listed on file    [provider]  PRESCRIPTION  MEDICATION Apply 1 application topically daily. Could not remember name, but has filled at [facility].    [provider]  QUEtiapine (SEROQUEL) 100 MG tablet Take 100 mg by mouth at bedtime.    [provider]  triamcinolone cream (KENALOG) 0.1 % Apply 1 application topically 2 (two) times daily. Patient not taking: Reported on 12/17/2015 12/11/15   Mellody Dance, DO    Family History Family History  Problem Relation Age of Onset  . Breast cancer Mother   . Alcohol abuse Father   . Depression Father     Social History Social History  Substance Use Topics  . Smoking status: Current Some Day Smoker    Types: Cigarettes     Last attempt to quit: 04/15/2015  . Smokeless tobacco: Never Used  . Alcohol use No     Comment: denies 01/09/17     Allergies   Fish allergy   Review of Systems Review of Systems  All other systems reviewed and are negative.    Physical Exam Updated Vital Signs BP (!) 154/75   Pulse 85   Temp 97.8 F (36.6 C)   Resp 19   Ht 5\' 5"  (1.651 m)   Wt 62.6 kg (138 lb)   SpO2 99%   BMI 22.96 kg/m   Physical Exam  Constitutional: She is oriented to person, place, and time. She appears well-developed and well-nourished.  HENT:  Head: Normocephalic and atraumatic.  Cardiovascular: Normal rate.   Pulmonary/Chest: Effort normal.  Musculoskeletal: Normal range of motion.  Mild swelling noted to the medial left wrist. Full rom. Neurovascularly intact  Neurological: She is alert and oriented to person, place, and time.  Nursing note and vitals reviewed.    ED Treatments / Results  Labs (all labs ordered are listed, but only abnormal results are displayed) Labs Reviewed - No data to display  EKG  EKG Interpretation None       Radiology Dg Wrist Complete Left  Result Date: 01/09/2017 CLINICAL DATA:  Fall. EXAM: LEFT WRIST - COMPLETE 3+ VIEW COMPARISON:  None. FINDINGS: There is no evidence of fracture or dislocation. There is no evidence of arthropathy or other focal bone abnormality. Soft tissues are unremarkable. IMPRESSION: Negative. Electronically Signed   By: Kerby Moors M.D.   On: 01/09/2017 11:00    Procedures Procedures (including critical care time)  Medications Ordered in ED Medications - No data to display   Initial Impression / Assessment and Plan / ED Course  I have reviewed the triage vital signs and the nursing notes.  Pertinent labs & imaging results that were available during my care of the patient were reviewed by me and considered in my medical decision making (see chart for details).     No acute fracture noted. Pt refusing splint here  she states will get at home. Discussed supportive care and follow up as needed  Final Clinical Impressions(s) / ED Diagnoses   Final diagnoses:  Sprain of left wrist, initial encounter  Sprain of right wrist, initial encounter    New Prescriptions New Prescriptions   No medications on file     Glendell Docker, NP 01/09/17 1211    Duffy Bruce, MD 01/10/17 1149

## 2017-01-09 NOTE — ED Triage Notes (Signed)
Pt c/o L wrist pain r/t a trip and fall x 2 days ago.  Pain score 8/10.  Limited ROM noted.

## 2017-01-09 NOTE — ED Notes (Addendum)
Pt is alert and orinted x 4 and is verbally responsive. Pt reports having a mechanical fall in her yard on Sat, by tripping over a wire. Pt  reports 8/10 shooting pain to her left wrist. Pt states that her wrist was swollen, and pain has been getting increasingly worse and radiates up to her left forearm. Pt has limited ROM to wrist + pulse. Area is swollen and tender with touch. Pt given ice. Pt denies taking medication for pain.

## 2017-01-09 NOTE — ED Notes (Signed)
Bed: WTR9 Expected date:  Expected time:  Means of arrival:  Comments: 

## 2017-03-21 ENCOUNTER — Emergency Department (HOSPITAL_COMMUNITY): Payer: Self-pay

## 2017-03-21 ENCOUNTER — Emergency Department (HOSPITAL_COMMUNITY)
Admission: EM | Admit: 2017-03-21 | Discharge: 2017-03-21 | Disposition: A | Discharge status: Home / Self Care | Payer: Self-pay | Attending: Emergency Medicine | Admitting: Emergency Medicine

## 2017-03-21 ENCOUNTER — Encounter (HOSPITAL_COMMUNITY): Payer: Self-pay | Admitting: Emergency Medicine

## 2017-03-21 DIAGNOSIS — K5792 Diverticulitis of intestine, part unspecified, without perforation or abscess without bleeding: Secondary | ICD-10-CM | POA: Insufficient documentation

## 2017-03-21 DIAGNOSIS — F17210 Nicotine dependence, cigarettes, uncomplicated: Secondary | ICD-10-CM | POA: Insufficient documentation

## 2017-03-21 DIAGNOSIS — I10 Essential (primary) hypertension: Secondary | ICD-10-CM | POA: Insufficient documentation

## 2017-03-21 DIAGNOSIS — R1033 Periumbilical pain: Secondary | ICD-10-CM | POA: Insufficient documentation

## 2017-03-21 LAB — URINALYSIS, ROUTINE W REFLEX MICROSCOPIC
Bilirubin Urine: NEGATIVE
GLUCOSE, UA: NEGATIVE mg/dL
Ketones, ur: NEGATIVE mg/dL
Leukocytes, UA: NEGATIVE
NITRITE: NEGATIVE
PH: 5 (ref 5.0–8.0)
Protein, ur: NEGATIVE mg/dL
Specific Gravity, Urine: 1.01 (ref 1.005–1.030)

## 2017-03-21 LAB — COMPREHENSIVE METABOLIC PANEL
ALK PHOS: 88 U/L (ref 38–126)
ALT: 10 U/L — AB (ref 14–54)
AST: 14 U/L — ABNORMAL LOW (ref 15–41)
Albumin: 4.5 g/dL (ref 3.5–5.0)
Anion gap: 6 (ref 5–15)
BILIRUBIN TOTAL: 0.5 mg/dL (ref 0.3–1.2)
BUN: 12 mg/dL (ref 6–20)
CALCIUM: 9.4 mg/dL (ref 8.9–10.3)
CO2: 29 mmol/L (ref 22–32)
CREATININE: 0.85 mg/dL (ref 0.44–1.00)
Chloride: 104 mmol/L (ref 101–111)
GFR calc non Af Amer: >60 mL/min (ref >60)
Glucose, Bld: 95 mg/dL (ref 65–99)
Potassium: 3.5 mmol/L (ref 3.5–5.1)
SODIUM: 139 mmol/L (ref 135–145)
TOTAL PROTEIN: 7.3 g/dL (ref 6.5–8.1)

## 2017-03-21 LAB — CBC
HCT: 40.2 % (ref 36.0–46.0)
Hemoglobin: 13.2 g/dL (ref 12.0–15.0)
MCH: 29.1 pg (ref 26.0–34.0)
MCHC: 32.8 g/dL (ref 30.0–36.0)
MCV: 88.7 fL (ref 78.0–100.0)
PLATELETS: 166 10*3/uL (ref 150–400)
RBC: 4.53 MIL/uL (ref 3.87–5.11)
RDW: 12.8 % (ref 11.5–15.5)
WBC: 6.5 10*3/uL (ref 4.0–10.5)

## 2017-03-21 LAB — LIPASE, BLOOD: Lipase: 32 U/L (ref 11–51)

## 2017-03-21 MED ORDER — IOPAMIDOL (ISOVUE-300) INJECTION 61%
100.0000 mL | Freq: Once | INTRAVENOUS | Status: AC | PRN
  Administered 2017-03-21: 100 mL via INTRAVENOUS

## 2017-03-21 MED ORDER — CIPROFLOXACIN HCL 500 MG PO TABS: 500.0000 mg | ORAL_TABLET | Freq: Two times a day (BID) | ORAL | 0 refills | Status: AC

## 2017-03-21 MED ORDER — METRONIDAZOLE 500 MG PO TABS
500.0000 mg | ORAL_TABLET | Freq: Once | ORAL | Status: AC
  Administered 2017-03-21: 500 mg via ORAL
  Filled 2017-03-21: qty 1

## 2017-03-21 MED ORDER — CIPROFLOXACIN HCL 500 MG PO TABS
500.0000 mg | ORAL_TABLET | Freq: Once | ORAL | Status: AC
  Administered 2017-03-21: 500 mg via ORAL
  Filled 2017-03-21: qty 1

## 2017-03-21 MED ORDER — METRONIDAZOLE 500 MG PO TABS: 500.0000 mg | ORAL_TABLET | Freq: Three times a day (TID) | ORAL | 0 refills | Status: AC

## 2017-03-21 MED ORDER — IOPAMIDOL (ISOVUE-300) INJECTION 61%
INTRAVENOUS | Status: AC
  Filled 2017-03-21: qty 100

## 2017-03-21 MED ORDER — SODIUM CHLORIDE 0.9 % IV BOLUS (SEPSIS)
1000.0000 mL | Freq: Once | INTRAVENOUS | Status: AC
  Administered 2017-03-21: 1000 mL via INTRAVENOUS

## 2017-03-21 NOTE — ED Provider Notes (Signed)
[facility] DEPT Provider Note   CSN: [id number] Arrival date & time: 03/21/17  1134     History   Chief Complaint Chief Complaint  Patient presents with  . Abdominal Pain  . Constipation    HPI Rebecca Espinoza is a 57 y.o. female with past medical history significant for bipolar, depression, GERD, hypertension, hyperlipidemia presenting with chronic abdominal pain since November.  Patient reports that she had a normal sex in November and has "experienced a blockage around November 16th", she has been taking laxative which has improved her symptoms but has been having problems with her bowel movements ever since.  She reports taking prune juice daily and has been worsening over the last several weeks.  She states that over the last 2 days she has not had a bowel movement and is having difficulty passing gas.  Also reports weight loss and muscle mass loss and is concerned that something is really wrong.  She states that she does not have a primary care provider or insurance and has not been followed by a physician other than at [facility] and psychiatrist.  she recently started new medications including Seroquel 3 months ago, Lamictal, Wellbutrin.  She also has been experiencing some nausea but no vomiting.  She believes that she is dehydrated that she has not been drinking very much water.  Denies fever, chills, dysuria, melena, blood in her stool or other symptoms.  HPI  Past Medical History:  Diagnosis Date  . Acid reflux   . Bipolar 1 disorder ([place])   . Depression   . Hyperlipidemia   . Hypertension   . Psoriasis     Patient Active Problem List   Diagnosis Date Noted  . Hypokalemia- mild 01/03/2016  . Subclinical hypothyroidism 01/03/2016  . History of tobacco use disorder- quit 2/17 12/06/2015  . Status post hysterectomy 12/06/2015  . Bipolar I disorder, most recent episode mixed ([place]) 11/30/2015  . GERD (gastroesophageal reflux disease) 11/30/2015   . Psoriasiform dermatitis 11/30/2015  . Mixed hyperlipidemia 11/30/2015  . History of breast augmentation 11/30/2015  . Chronic fatigue 11/30/2015  . Hypertension, benign essential, goal below 140/90 11/30/2015  . H/O abnormal cervical Papanicolaou smear 11/30/2015  . Memory changes 11/30/2015  . Marijuana smoker, continuous 11/30/2015  . Ache in joint 11/26/2015  . Gen Arthritis 11/26/2015  . Family history of breast cancer in first degree relative 11/26/2015  . Moderate episode of recurrent major depressive disorder ([place]) 11/26/2015  . Acute stress disorder 11/26/2015  . Vitamin D deficiency 11/26/2015    Past Surgical History:  Procedure Laterality Date  . ABDOMINAL HYSTERECTOMY    . BLADDER REPAIR    . BREAST ENHANCEMENT SURGERY    . RECTAL PROLAPSE REPAIR    . TONSILLECTOMY      OB History    Gravida Para Term Preterm AB Living   5 4 4   1 4    SAB TAB Ectopic Multiple Live Births   1       4       Home Medications    Prior to Admission medications   Medication Sig Start Date End Date Taking? Authorizing Provider  acetaminophen (TYLENOL) 500 MG tablet Take 500 mg by mouth every 6 (six) hours as needed for moderate pain.   Yes [provider]  BuPROPion HCl (WELLBUTRIN XL PO) Take 1 tablet by mouth daily. Filled at [facility]; pt unsure of strength, just knew name and once daily dosing   Yes [provider]  hydrochlorothiazide (HYDRODIURIL) 25 MG tablet Take 25 mg by mouth daily.   Yes [provider]  hydrocortisone cream 1 % Apply 1 application topically daily.   Yes [provider]  lamoTRIgine (LAMICTAL) 25 MG tablet Take 75 mg by mouth at bedtime.    Yes [provider]  lisinopril (PRINIVIL,ZESTRIL) 5 MG tablet Take 5 mg by mouth daily.   Yes [provider]  QUEtiapine (SEROQUEL) 100 MG tablet Take 100 mg by mouth at bedtime.   Yes [provider]  Sertraline HCl (ZOLOFT PO) Take by mouth daily.   Yes  [provider]  ciprofloxacin (CIPRO) 500 MG tablet Take 1 tablet (500 mg total) by mouth 2 (two) times daily for 10 days. 03/21/17 03/31/17  Avie Echevaria B, PA-C  metroNIDAZOLE (FLAGYL) 500 MG tablet Take 1 tablet (500 mg total) by mouth 3 (three) times daily for 10 days. 03/21/17 03/31/17  Emeline General, PA-C    Family History Family History  Problem Relation Age of Onset  . Breast cancer Mother   . Alcohol abuse Father   . Depression Father     Social History Social History   Tobacco Use  . Smoking status: Current Some Day Smoker    Types: Cigarettes    Last attempt to quit: 04/15/2015    Years since quitting: 1.9  . Smokeless tobacco: Never Used  Substance Use Topics  . Alcohol use: No    Comment: denies 01/09/17  . Drug use: Yes    Types: Marijuana     Allergies   Fish allergy   Review of Systems Review of Systems  Constitutional: Positive for appetite change and unexpected weight change. Negative for chills, diaphoresis and fever.  HENT: Negative for congestion.   Respiratory: Negative for cough, chest tightness, shortness of breath, wheezing and stridor.   Cardiovascular: Negative for chest pain and palpitations.  Gastrointestinal: Positive for abdominal distention, abdominal pain, constipation, diarrhea and nausea. Negative for anal bleeding, blood in stool, rectal pain and vomiting.  Genitourinary: Negative for difficulty urinating, dysuria, flank pain, frequency, hematuria, pelvic pain, vaginal bleeding, vaginal discharge and vaginal pain.  Musculoskeletal: Negative for arthralgias, back pain, gait problem, myalgias, neck pain and neck stiffness.  Skin: Negative for color change and rash.  Neurological: Negative for dizziness, seizures, syncope, weakness, light-headedness, numbness and headaches.     Physical Exam Updated Vital Signs BP (!) 152/79 (BP Location: Right Arm)   Pulse 70   Temp 98.7 F (37.1 C) (Oral)   Resp 16   SpO2 100%    Physical Exam  Constitutional: She appears well-developed and well-nourished.  Non-toxic appearance. She does not appear ill. No distress.  Afebrile, nontoxic-appearing, lying comfortably in bed no acute distress.  HENT:  Head: Normocephalic and atraumatic.  Eyes: Conjunctivae and EOM are normal. No scleral icterus.  Neck: Neck supple.  Cardiovascular: Normal rate and regular rhythm.  No murmur heard. Pulmonary/Chest: Effort normal and breath sounds normal. No stridor. No respiratory distress. She has no wheezes. She has no rhonchi. She has no rales.  Abdominal: Soft. Normal appearance and bowel sounds are normal. There is tenderness in the right lower quadrant and suprapubic area. There is tenderness at McBurney's point. There is no rigidity, no rebound, no guarding, no CVA tenderness and negative Murphy's sign.  Musculoskeletal: She exhibits no edema.  Neurological: She is alert.  Skin: Skin is warm and dry. No rash noted. She is not diaphoretic. No erythema. No pallor.  Psychiatric: She has a normal mood and affect.  Nursing note and vitals reviewed.    ED Treatments / Results  Labs (all labs ordered are listed, but only abnormal results are displayed) Labs Reviewed  COMPREHENSIVE METABOLIC PANEL - Abnormal; Notable for the following components:      Result Value   AST 14 (*)    ALT 10 (*)    All other components within normal limits  URINALYSIS, ROUTINE W REFLEX MICROSCOPIC - Abnormal; Notable for the following components:   Color, Urine STRAW (*)    Hgb urine dipstick MODERATE (*)    Bacteria, UA RARE (*)    Squamous Epithelial / LPF 0-5 (*)    All other components within normal limits  LIPASE, BLOOD  CBC    EKG  EKG Interpretation None       Radiology Ct Abdomen Pelvis W Contrast  Result Date: 03/21/2017 CLINICAL DATA:  Abdominal pain and constipation. Concern for appendicitis. EXAM: CT ABDOMEN AND PELVIS WITH CONTRAST TECHNIQUE: Multidetector CT imaging of  the abdomen and pelvis was performed using the standard protocol following bolus administration of intravenous contrast. CONTRAST:  179mL ISOVUE-300 IOPAMIDOL (ISOVUE-300) INJECTION 61% COMPARISON:  07/09/2016 FINDINGS: Lower chest: Normal Hepatobiliary: Normal Pancreas: Normal Spleen: Normal Adrenals/Urinary Tract: Adrenal glands are normal. Kidneys are normal. No cyst, mass, stone or hydronephrosis. No abnormality of either ureter. Bladder appears normal. Stomach/Bowel: Appendix is normal. There is mild sigmoid diverticulosis. Question mild diverticulitis of a lower sigmoid diverticulum. This is particularly suggestive when viewed on the sagittal and coronal reconstructions. Very minimal and early changes. Vascular/Lymphatic: Mild aortic atherosclerosis. No aneurysm. IVC is normal. No retroperitoneal adenopathy. Reproductive: Previous hysterectomy.  No pelvic mass. Other: No free fluid or air. Musculoskeletal: Negative IMPRESSION: Suspicion of early sigmoid diverticulitis. No advanced disease. Appendix is normal. Electronically Signed   By: Nelson Chimes M.D.   On: 03/21/2017 19:13    Procedures Procedures (including critical care time)  Medications Ordered in ED Medications  iopamidol (ISOVUE-300) 61 % injection (not administered)  metroNIDAZOLE (FLAGYL) tablet 500 mg (not administered)  ciprofloxacin (CIPRO) tablet 500 mg (not administered)  sodium chloride 0.9 % bolus 1,000 mL (1,000 mLs Intravenous New Bag/Given 03/21/17 1835)  iopamidol (ISOVUE-300) 61 % injection 100 mL (100 mLs Intravenous Contrast Given 03/21/17 1843)     Initial Impression / Assessment and Plan / ED Course  I have reviewed the triage vital signs and the nursing notes.  Pertinent labs & imaging results that were available during my care of the patient were reviewed by me and considered in my medical decision making (see chart for details).     Patient presents anxious appearing with multiple vague complaints including  chronic abdominal pain, constipation and diarrhea, and worsening right lower quadrant/suprapubic discomfort over the last 2 days. She reports that she has not had a bowel movement in 2 days and is unable to pass gas.  Patient has tenderness to palpation at McBurney's point  Labs unremarkable CT abdomen pelvis IV fluids Patient declined anything for pain or nausea at this time. Will reassess  CT with normal appendix and evidence of early uncomplicated sigmoid diverticulitis.  Patient overall improved while in the emergency department.  She was given her first dose of antibiotics in the ED.  Discharge home with close follow-up with GI and PCP. Patient was given resources for low cost medications and wellness center.  Discussed strict return precautions and advised to return to the emergency department if experiencing any new or  worsening symptoms. Instructions were understood and patient agreed with discharge plan.  Final Clinical Impressions(s) / ED Diagnoses   Final diagnoses:  Diverticulitis    ED Discharge Orders        Ordered    metroNIDAZOLE (FLAGYL) 500 MG tablet  3 times daily     03/21/17 1954    ciprofloxacin (CIPRO) 500 MG tablet  2 times daily     03/21/17 1954       Dossie Der 03/21/17 2012    Charlesetta Shanks, MD 04/01/17 [phone]

## 2017-03-21 NOTE — Discharge Instructions (Signed)
As discussed, take your entire course of antibiotics even if you feel better.  Drink plenty of fluids and stay well-hydrated. Follow up with primary care and GI as needed.  Return sooner if symptoms worsen or new concerning symptoms in the meantime.

## 2017-03-21 NOTE — ED Triage Notes (Signed)
Patient c/o abd pain and constipation issues since November. Has tried laxatives and has small BMs. Patient reports that she doesn't drink a lot of fluids or eating properly and does smoke.

## 2017-03-21 NOTE — ED Notes (Signed)
Pt ambulated to the restroom without assistance or difficulty.  ?

## 2017-03-21 NOTE — ED Notes (Signed)
Bed: WA02 Expected date:  Expected time:  Means of arrival:  Comments: No Bed

## 2017-07-20 ENCOUNTER — Other Ambulatory Visit: Payer: Self-pay | Admitting: Nurse Practitioner

## 2017-07-20 DIAGNOSIS — N6320 Unspecified lump in the left breast, unspecified quadrant: Secondary | ICD-10-CM

## 2017-07-21 ENCOUNTER — Encounter (INDEPENDENT_AMBULATORY_CARE_PROVIDER_SITE_OTHER): Payer: Self-pay | Admitting: Orthopaedic Surgery

## 2017-07-21 ENCOUNTER — Ambulatory Visit (INDEPENDENT_AMBULATORY_CARE_PROVIDER_SITE_OTHER): Payer: BLUE CROSS/BLUE SHIELD | Admitting: Orthopaedic Surgery

## 2017-07-21 VITALS — BP 125/71 | HR 89 | Ht 65.0 in | Wt 130.0 lb

## 2017-07-21 DIAGNOSIS — M7532 Calcific tendinitis of left shoulder: Secondary | ICD-10-CM | POA: Diagnosis not present

## 2017-07-21 DIAGNOSIS — M7121 Synovial cyst of popliteal space [Baker], right knee: Secondary | ICD-10-CM

## 2017-07-21 MED ORDER — LIDOCAINE HCL 1 % IJ SOLN
0.5000 mL | INTRAMUSCULAR | Status: AC | PRN
  Administered 2017-07-21: .5 mL

## 2017-07-21 MED ORDER — METHYLPREDNISOLONE ACETATE 40 MG/ML IJ SUSP
40.0000 mg | INTRAMUSCULAR | Status: AC | PRN
  Administered 2017-07-21: 40 mg via INTRA_ARTICULAR

## 2017-07-21 MED ORDER — BUPIVACAINE HCL 0.25 % IJ SOLN
4.0000 mL | INTRAMUSCULAR | Status: AC | PRN
  Administered 2017-07-21: 4 mL via INTRA_ARTICULAR

## 2017-07-21 NOTE — Progress Notes (Signed)
Office Visit Note   Patient: Rebecca Espinoza           Date of Birth: 1960-07-22           MRN: [id number] Visit Date: 07/21/2017              Requested by: Ferd Hibbs, NP [place], [place] [address] PCP: Ferd Hibbs, NP   Assessment & Plan: Visit Diagnoses:  1. Calcific tendonitis of left shoulder   2. Baker's cyst of knee, right     Plan: Subacromial injection performed left shoulder for her calcific tendinitis with impingement provement in her symptoms.  We will check her back in 4 weeks if she is continuing to have some pain between the shoulder blades we can obtain AP and lateral cervical spine x-rays and repeat her exam of her shoulder.  X-ray images were reviewed with patient viewing her left shoulder.  Calcific tendinopathy discussed.  Follow-Up Instructions: No follow-ups on file.   Orders:  Orders Placed This Encounter  Procedures  . Large Joint Inj   No orders of the defined types were placed in this encounter.     Procedures: Large Joint Inj: L subacromial bursa on 07/21/2017 10:50 AM Indications: pain Details: 22 G 1.5 in needle  Arthrogram: No  Medications: 4 mL bupivacaine 0.25 %; 40 mg methylPREDNISolone acetate 40 MG/ML; 0.5 mL lidocaine 1 % Outcome: tolerated well, no immediate complications Procedure, treatment alternatives, risks and benefits explained, specific risks discussed. Consent was given by the patient. Immediately prior to procedure a time out was called to verify the correct patient, procedure, equipment, support staff and site/side marked as required. Patient was prepped and draped in the usual sterile fashion.       Clinical Data: No additional findings.   Subjective: Chief Complaint  Patient presents with  . 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  She is occasionally had a  little catching in her right hip not consistent and does have psoriasis.  She is had left shoulder pain which is her principal problem.  She states she has pain in the supraspinatus fossa pain between the shoulder blades.  Increased pain with range of motion of her left shoulder.  She is noticed a small 2 cm nodule near the deltoid insertion site which is not tender.  She has some pain in the posterior joint particularly with activities.  She is concerned that she has some rotator cuff injury to her left shoulder.  Left shoulder x-rays 2017 showed calcific tendinopathy of the rotator cuff at the greater tuberosity.  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  Previous augmentation.  Previous hysterectomy tonsillectomy as a child.  Rectal repair bladder tack up procedure.  Positive for hypertension.  Arthritis, depression, bipolar disorder, PTSD.   Objective: Vital Signs: BP 125/71   Pulse 89   Ht 5\' 5"  (1.651 m)   Wt 130 lb (59 kg)   BMI 21.63 kg/m   Physical Exam  Constitutional: She is oriented to person, place, and time. She appears well-developed.  HENT:  Head: Normocephalic.  Right Ear: External ear normal.  Left Ear: External ear normal.  Eyes: Pupils are equal, round, and reactive to light.  Neck: No tracheal deviation present. No thyromegaly present.  Cardiovascular: Normal rate.  Pulmonary/Chest: Effort normal.  Abdominal: Soft.  Neurological: She is  alert and oriented to person, place, and time.  Skin: Skin is warm and dry.  Psychiatric: She has a normal mood and affect. Her behavior is normal.    Ortho Exam patient has negative drop arm test subscap external rotators are strong.  Positive impingement left shoulder.  2 mm nodule which appears deep and subcu tissue slightly mobile adjacent to the deltoid insertion site nontender.  Patient has some psoriatic spots on both legs from the knee down.  Small Baker's cyst palpable in the left knee without anterior knee effusion  minimal crepitus full extension full flexion ligamentous exam is normal no joint line tenderness.  Negative Lockman.  Ankle range of motion is normal.  Distal pulses are 2+.  Specialty Comments:  No specialty comments available.  Imaging: No results found.   PMFS History: There are no active problems to display for this patient.  Past Medical History:  Diagnosis Date  . Arthritis   . Bipolar 1 disorder ([place])   . Depression   . Hypertension   . Post traumatic stress disorder     No family history on file.  Past Surgical History:  Procedure Laterality Date  . ABDOMINAL HYSTERECTOMY    . BLADDER SURGERY    . BREAST ENHANCEMENT SURGERY    . REPAIR OF RECTAL PROLAPSE    . TONSILLECTOMY     Social History   Occupational History  . Not on file  Tobacco Use  . Smoking status: Current Every Day Smoker    Packs/day: 0.50    Types: Cigarettes  . Smokeless tobacco: Never Used  Substance and Sexual Activity  . Alcohol use: Yes    Comment: occasional  . Drug use: Not on file  . Sexual activity: Not on file

## 2017-07-24 ENCOUNTER — Encounter (HOSPITAL_COMMUNITY): Payer: Self-pay | Admitting: Emergency Medicine

## 2017-07-27 ENCOUNTER — Ambulatory Visit
Admission: RE | Admit: 2017-07-27 | Discharge: 2017-07-27 | Disposition: A | Discharge status: Home / Self Care | Payer: PRIVATE HEALTH INSURANCE | Source: Ambulatory Visit | Attending: Nurse Practitioner | Admitting: Nurse Practitioner

## 2017-07-27 ENCOUNTER — Other Ambulatory Visit: Payer: Self-pay | Admitting: Nurse Practitioner

## 2017-07-27 DIAGNOSIS — N6320 Unspecified lump in the left breast, unspecified quadrant: Secondary | ICD-10-CM

## 2017-08-08 ENCOUNTER — Ambulatory Visit (INDEPENDENT_AMBULATORY_CARE_PROVIDER_SITE_OTHER): Payer: BLUE CROSS/BLUE SHIELD | Admitting: Orthopaedic Surgery

## 2017-08-08 ENCOUNTER — Encounter (INDEPENDENT_AMBULATORY_CARE_PROVIDER_SITE_OTHER): Payer: Self-pay | Admitting: Orthopaedic Surgery

## 2017-08-08 VITALS — BP 140/83 | HR 88 | Ht 65.0 in | Wt 132.0 lb

## 2017-08-08 DIAGNOSIS — M25512 Pain in left shoulder: Secondary | ICD-10-CM | POA: Diagnosis not present

## 2017-08-08 DIAGNOSIS — M75122 Complete rotator cuff tear or rupture of left shoulder, not specified as traumatic: Secondary | ICD-10-CM | POA: Diagnosis not present

## 2017-08-08 DIAGNOSIS — M7532 Calcific tendinitis of left shoulder: Secondary | ICD-10-CM

## 2017-08-08 MED ORDER — HYDROCODONE-ACETAMINOPHEN 5-325 MG PO TABS: 1.0000 | ORAL_TABLET | Freq: Three times a day (TID) | ORAL | 0 refills | Status: DC | PRN

## 2017-08-08 NOTE — Addendum Note (Signed)
Addended by: Meyer Cory on: 08/08/2017 10:33 AM   Modules accepted: Orders

## 2017-08-08 NOTE — Progress Notes (Signed)
Office Visit Note   Patient: Rebecca Espinoza           Date of Birth: 02/09/1961           MRN: [id number] Visit Date: 08/08/2017              Requested by: Ferd Hibbs, NP [place], [place] [address] PCP: Ferd Hibbs, NP   Assessment & Plan: Visit Diagnoses:  1. Complete tear of left rotator cuff, unspecified whether traumatic   2. Calcific tendinitis of left shoulder     Plan: Norco 5/325 prescribed number 20 tablets 1 p.o. 3 times daily as needed.  MRI scan left shoulder rule out rotator cuff tear.  Office follow-up after scan.  Follow-Up Instructions: after MRI left shoulder  Orders:  No orders of the defined types were placed in this encounter.  No orders of the defined types were placed in this encounter.     Procedures: No procedures performed   Clinical Data: No additional findings.   Subjective: Chief Complaint  Patient presents with  . Left Shoulder - Pain    HPI 57 year old female returns she is in a sling that she bought at the store.  After I saw her and did show a subacromial injection she went out and trim some hedges trees limbs using shears and clippers and now has severe left shoulder pain.  States she cannot sleep she cannot move she has not been able to take a shower she is having trouble getting her close on.  She states the pain is worse than childbirth.  Previous x-rays showed calcific tendinopathy involving the supraspinatus tendon.  Patient today only has 4 to 5 degrees of flexion and cannot abduct her shoulder she is sitting rhythmically writhing in pain.  Patient rates her pain in the left shoulder at 10 out of 10.  Review of Systems 14 point review of systems updated unchanged.  Positive for history of bipolar disorder she is taking her medications regularly.  Otherwise review of systems unchanged from last office visit.   Objective: Vital Signs: BP 140/83   Pulse 88   Ht 5\' 5"  (1.651 m)   Wt 132 lb (59.9 kg)   BMI  21.97 kg/m   Physical Exam  Constitutional: She is oriented to person, place, and time. She appears well-developed.  HENT:  Head: Normocephalic.  Right Ear: External ear normal.  Left Ear: External ear normal.  Eyes: Pupils are equal, round, and reactive to light.  Neck: No tracheal deviation present. No thyromegaly present.  Cardiovascular: Normal rate.  Pulmonary/Chest: Effort normal.  Abdominal: Soft.  Neurological: She is alert and oriented to person, place, and time.  Skin: Skin is warm and dry.  Psychiatric: She has a normal mood and affect. Her behavior is normal.    Ortho Exam patient is able to have her shoulder flexed 5 to 10 degrees and then she has shoulder spasms.  She has extreme pain cannot actively flex extend abduct or internally externally rotate.  Sensation her hand is intact there is no edema no ecchymosis long of the biceps is intact.  Specialty Comments:  No specialty comments available.  Imaging: No results found.   PMFS History: Patient Active Problem List   Diagnosis Date Noted  . Hypokalemia- mild 01/03/2016  . Subclinical hypothyroidism 01/03/2016  . History of tobacco use disorder- quit 2/17 12/06/2015  . Status post hysterectomy 12/06/2015  . Bipolar I disorder, most recent episode mixed ([place]) 11/30/2015  .  GERD (gastroesophageal reflux disease) 11/30/2015  . Psoriasiform dermatitis 11/30/2015  . Mixed hyperlipidemia 11/30/2015  . History of breast augmentation 11/30/2015  . Chronic fatigue 11/30/2015  . Hypertension, benign essential, goal below 140/90 11/30/2015  . H/O abnormal cervical Papanicolaou smear 11/30/2015  . Memory changes 11/30/2015  . Marijuana smoker, continuous 11/30/2015  . Ache in joint 11/26/2015  . Gen Arthritis 11/26/2015  . Family history of breast cancer in first degree relative 11/26/2015  . Moderate episode of recurrent major depressive disorder ([place]) 11/26/2015  . Acute stress disorder 11/26/2015  . Vitamin D  deficiency 11/26/2015   Past Medical History:  Diagnosis Date  . Acid reflux   . Arthritis   . Bipolar 1 disorder ([place])   . Depression   . Hyperlipidemia   . Hypertension   . Post traumatic stress disorder   . Psoriasis     Family History  Problem Relation Age of Onset  . Breast cancer Mother   . Alcohol abuse Father   . Depression Father     Past Surgical History:  Procedure Laterality Date  . ABDOMINAL HYSTERECTOMY    . AUGMENTATION MAMMAPLASTY Bilateral   . BLADDER REPAIR    . BLADDER SURGERY    . BREAST ENHANCEMENT SURGERY    . RECTAL PROLAPSE REPAIR    . REPAIR OF RECTAL PROLAPSE    . TONSILLECTOMY     Social History   Occupational History  . Not on file  Tobacco Use  . Smoking status: Current Every Day Smoker    Packs/day: 0.50    Types: Cigarettes  . Smokeless tobacco: Never Used  Substance and Sexual Activity  . Alcohol use: Yes    Comment: occasional  . Drug use: Yes    Types: Marijuana  . Sexual activity: Yes    Birth control/protection: None, Surgical

## 2017-08-11 ENCOUNTER — Encounter (HOSPITAL_COMMUNITY): Payer: Self-pay | Admitting: Emergency Medicine

## 2017-08-11 ENCOUNTER — Other Ambulatory Visit: Payer: Self-pay

## 2017-08-11 ENCOUNTER — Emergency Department (HOSPITAL_COMMUNITY): Payer: BLUE CROSS/BLUE SHIELD

## 2017-08-11 DIAGNOSIS — M25512 Pain in left shoulder: Secondary | ICD-10-CM | POA: Diagnosis present

## 2017-08-11 DIAGNOSIS — M6520 Calcific tendinitis, unspecified site: Secondary | ICD-10-CM | POA: Diagnosis not present

## 2017-08-11 DIAGNOSIS — F17210 Nicotine dependence, cigarettes, uncomplicated: Secondary | ICD-10-CM | POA: Diagnosis not present

## 2017-08-11 DIAGNOSIS — E038 Other specified hypothyroidism: Secondary | ICD-10-CM | POA: Insufficient documentation

## 2017-08-11 DIAGNOSIS — Z79899 Other long term (current) drug therapy: Secondary | ICD-10-CM | POA: Diagnosis not present

## 2017-08-11 DIAGNOSIS — I10 Essential (primary) hypertension: Secondary | ICD-10-CM | POA: Insufficient documentation

## 2017-08-11 LAB — BASIC METABOLIC PANEL
Anion gap: 9 (ref 5–15)
BUN: 12 mg/dL (ref 6–20)
CHLORIDE: 103 mmol/L (ref 101–111)
CO2: 26 mmol/L (ref 22–32)
CREATININE: 0.73 mg/dL (ref 0.44–1.00)
Calcium: 9.8 mg/dL (ref 8.9–10.3)
GFR calc Af Amer: >60 mL/min (ref >60)
GFR calc non Af Amer: >60 mL/min (ref >60)
GLUCOSE: 114 mg/dL — AB (ref 65–99)
POTASSIUM: 3.4 mmol/L — AB (ref 3.5–5.1)
SODIUM: 138 mmol/L (ref 135–145)

## 2017-08-11 LAB — CBC
HEMATOCRIT: 39.4 % (ref 36.0–46.0)
Hemoglobin: 13.1 g/dL (ref 12.0–15.0)
MCH: 29.6 pg (ref 26.0–34.0)
MCHC: 33.2 g/dL (ref 30.0–36.0)
MCV: 88.9 fL (ref 78.0–100.0)
PLATELETS: 232 10*3/uL (ref 150–400)
RBC: 4.43 MIL/uL (ref 3.87–5.11)
RDW: 13 % (ref 11.5–15.5)
WBC: 10.1 10*3/uL (ref 4.0–10.5)

## 2017-08-11 LAB — I-STAT BETA HCG BLOOD, ED (MC, WL, AP ONLY): HCG, QUANTITATIVE: 6.5 m[IU]/mL — AB (ref <5)

## 2017-08-11 LAB — I-STAT TROPONIN, ED: Troponin i, poc: 0 ng/mL (ref 0.00–0.08)

## 2017-08-11 NOTE — ED Triage Notes (Addendum)
Patient is complaining of mid chest pain. Patient states she has had developed a fever. Patient is also complaining of upper back pain. Patient has taken multiple pain medications. Patient is complaining of left shoulder pain.

## 2017-08-12 ENCOUNTER — Emergency Department (HOSPITAL_COMMUNITY): Payer: BLUE CROSS/BLUE SHIELD

## 2017-08-12 ENCOUNTER — Emergency Department (HOSPITAL_COMMUNITY)
Admission: EM | Admit: 2017-08-12 | Discharge: 2017-08-12 | Disposition: A | Discharge status: Home / Self Care | Payer: BLUE CROSS/BLUE SHIELD | Attending: Emergency Medicine | Admitting: Emergency Medicine

## 2017-08-12 ENCOUNTER — Encounter (HOSPITAL_COMMUNITY): Payer: Self-pay | Admitting: Emergency Medicine

## 2017-08-12 DIAGNOSIS — M6520 Calcific tendinitis, unspecified site: Secondary | ICD-10-CM

## 2017-08-12 LAB — I-STAT TROPONIN, ED: TROPONIN I, POC: 0 ng/mL (ref 0.00–0.08)

## 2017-08-12 MED ORDER — KETOROLAC TROMETHAMINE 60 MG/2ML IM SOLN
30.0000 mg | Freq: Once | INTRAMUSCULAR | Status: AC
  Administered 2017-08-12: 30 mg via INTRAMUSCULAR
  Filled 2017-08-12: qty 2

## 2017-08-12 MED ORDER — LIDOCAINE 5 % EX PTCH: 1.0000 | MEDICATED_PATCH | CUTANEOUS | 0 refills | Status: DC

## 2017-08-12 MED ORDER — LIDOCAINE 5 % EX PTCH
2.0000 | MEDICATED_PATCH | CUTANEOUS | Status: DC
  Administered 2017-08-12: 2 via TRANSDERMAL
  Filled 2017-08-12: qty 2

## 2017-08-12 MED ORDER — DICLOFENAC SODIUM ER 100 MG PO TB24: 100.0000 mg | ORAL_TABLET | Freq: Every day | ORAL | 0 refills | Status: DC

## 2017-08-12 MED ORDER — METHOCARBAMOL 500 MG PO TABS
1000.0000 mg | ORAL_TABLET | Freq: Once | ORAL | Status: AC
  Administered 2017-08-12: 1000 mg via ORAL
  Filled 2017-08-12: qty 2

## 2017-08-12 NOTE — ED Provider Notes (Signed)
[facility] DEPT Provider Note   CSN: [id number] Arrival date & time: 08/11/17  1953     History   Chief Complaint Chief Complaint  Patient presents with  . Chest Pain  . Back Pain  . Shoulder Pain    HPI Rebecca Espinoza is a 57 y.o. female.  The history is provided by the patient.  Shoulder Pain   This is a chronic problem. The current episode started more than 1 week ago (years). The problem occurs constantly. The problem has been rapidly worsening. The pain is present in the left shoulder. The quality of the pain is described as aching and constant. The pain is at a severity of 10/10. The pain is severe. Pertinent negatives include no numbness. Associated symptoms comments: She placed a sling she bought at the store on it and now cannot move it.  . Treatments tried: norco. The treatment provided no relief. There has been no history of extremity trauma. Family history is significant for no rheumatoid arthritis.  This is not chest pain.  No exertional symptoms no SOB or DOE.  This is all her shoulder that Dr. Lorin Mercy injected and she states he did not even touch it.    Past Medical History:  Diagnosis Date  . Acid reflux   . Arthritis   . Bipolar 1 disorder ([place])   . Depression   . Hyperlipidemia   . Hypertension   . Post traumatic stress disorder   . Psoriasis     Patient Active Problem List   Diagnosis Date Noted  . Calcific tendinitis of left shoulder 08/08/2017  . Hypokalemia- mild 01/03/2016  . Subclinical hypothyroidism 01/03/2016  . History of tobacco use disorder- quit 2/17 12/06/2015  . Status post hysterectomy 12/06/2015  . Bipolar I disorder, most recent episode mixed ([place]) 11/30/2015  . GERD (gastroesophageal reflux disease) 11/30/2015  . Psoriasiform dermatitis 11/30/2015  . Mixed hyperlipidemia 11/30/2015  . History of breast augmentation 11/30/2015  . Chronic fatigue 11/30/2015  . Hypertension, benign essential, goal below  140/90 11/30/2015  . H/O abnormal cervical Papanicolaou smear 11/30/2015  . Memory changes 11/30/2015  . Marijuana smoker, continuous 11/30/2015  . Ache in joint 11/26/2015  . Gen Arthritis 11/26/2015  . Family history of breast cancer in first degree relative 11/26/2015  . Moderate episode of recurrent major depressive disorder ([place]) 11/26/2015  . Acute stress disorder 11/26/2015  . Vitamin D deficiency 11/26/2015    Past Surgical History:  Procedure Laterality Date  . ABDOMINAL HYSTERECTOMY    . AUGMENTATION MAMMAPLASTY Bilateral   . BLADDER REPAIR    . BLADDER SURGERY    . BREAST ENHANCEMENT SURGERY    . RECTAL PROLAPSE REPAIR    . REPAIR OF RECTAL PROLAPSE    . TONSILLECTOMY       OB History    Gravida  5   Para  4   Term  4   Preterm  0   AB  1   Living        SAB  1   TAB  0   Ectopic  0   Multiple      Live Births               Home Medications    Prior to Admission medications   Medication Sig Start Date End Date Taking? Authorizing Provider  acetaminophen (TYLENOL) 500 MG tablet Take 500 mg by mouth every 6 (six) hours as needed for moderate pain.  [provider]  buPROPion (ZYBAN) 150 MG 12 hr tablet Take 150 mg by mouth 2 (two) times daily.    [provider]  BuPROPion HCl (WELLBUTRIN XL PO) Take 1 tablet by mouth daily. Filled at [facility]; pt unsure of strength, just knew name and once daily dosing    [provider]  hydrochlorothiazide (HYDRODIURIL) 25 MG tablet Take 25 mg by mouth daily.    [provider]  hydrochlorothiazide (HYDRODIURIL) 25 MG tablet Take 25 mg by mouth daily.    [provider]  HYDROcodone-acetaminophen (NORCO/VICODIN) 5-325 MG tablet Take 1 tablet by mouth every 8 (eight) hours as needed for moderate pain. 08/08/17   Marybelle Killings, MD  hydrocortisone cream 1 % Apply 1 application topically daily.    [provider]  lamoTRIgine (LAMICTAL) 25 MG tablet Take 75 mg  by mouth at bedtime.     [provider]  lisinopril (PRINIVIL,ZESTRIL) 5 MG tablet Take 5 mg by mouth daily.    [provider]  lisinopril (PRINIVIL,ZESTRIL) 5 MG tablet Take 5 mg by mouth daily.    [provider]  QUEtiapine (SEROQUEL) 100 MG tablet Take 100 mg by mouth at bedtime.    [provider]  QUEtiapine (SEROQUEL) 100 MG tablet Take 100 mg by mouth at bedtime.    [provider]  sertraline (ZOLOFT) 100 MG tablet Take 80 mg by mouth daily.    [provider]  Sertraline HCl (ZOLOFT PO) Take by mouth daily.    [provider]    Family History Family History  Problem Relation Age of Onset  . Breast cancer Mother   . Alcohol abuse Father   . Depression Father     Social History Social History   Tobacco Use  . Smoking status: Current Every Day Smoker    Packs/day: 0.50    Types: Cigarettes  . Smokeless tobacco: Never Used  Substance Use Topics  . Alcohol use: Yes    Comment: occasional  . Drug use: Yes    Types: Marijuana     Allergies   Fish allergy and Other   Review of Systems Review of Systems  Constitutional: Negative for diaphoresis and fever.  Respiratory: Negative for cough, shortness of breath, wheezing and stridor.   Cardiovascular: Negative for chest pain, palpitations and leg swelling.  Gastrointestinal: Negative for abdominal pain, diarrhea and vomiting.  Musculoskeletal: Positive for arthralgias. Negative for gait problem, joint swelling, neck pain and neck stiffness.  Neurological: Negative for numbness.  All other systems reviewed and are negative.    Physical Exam Updated Vital Signs BP (!) 150/76 (BP Location: Right Arm)   Pulse 88   Temp 98.7 F (37.1 C) (Oral)   Resp 16   Ht 5\' 5"  (1.651 m)   Wt 59.4 kg (131 lb)   SpO2 98%   BMI 21.80 kg/m   Physical Exam  Constitutional: She appears well-developed and well-nourished. No distress.  HENT:  Head: Normocephalic  and atraumatic.  Mouth/Throat: Oropharynx is clear and moist. No oropharyngeal exudate.  Eyes: Pupils are equal, round, and reactive to light. Conjunctivae are normal.  Neck: Normal range of motion. Neck supple.  Cardiovascular: Normal rate, regular rhythm, normal heart sounds and intact distal pulses.  Pulmonary/Chest: Effort normal and breath sounds normal. No stridor. She has no wheezes. She has no rales.  Abdominal: Soft. Bowel sounds are normal. She exhibits no mass. There is no tenderness. There is no rebound and no guarding.  Musculoskeletal:       Left shoulder: She exhibits decreased range of motion. She exhibits no bony tenderness, no swelling, no effusion, no crepitus, no deformity, no laceration, no pain, no spasm, normal pulse and normal strength.       Left elbow: Normal.       Left wrist: Normal.       Left upper arm: Normal.       Left hand: Normal. She exhibits normal capillary refill. Normal sensation noted. Normal strength noted.  Screams with movement of the left shoulder,  She is able to move to 20 degrees actively, deltoid is intact no winging of the scapula, both heads of the biceps are intact intact pronation and supination of the L forearm no snuff box tenderness 3+ radial pulse.    Skin: Skin is warm and dry. Capillary refill takes less than 2 seconds.  Psychiatric: Her affect is labile.  Crying and screaming     ED Treatments / Results  Labs (all labs ordered are listed, but only abnormal results are displayed) Results for orders placed or performed during the hospital encounter of 96/22/29  Basic metabolic panel  Result Value Ref Range   Sodium 138 135 - 145 mmol/L   Potassium 3.4 (L) 3.5 - 5.1 mmol/L   Chloride 103 101 - 111 mmol/L   CO2 26 22 - 32 mmol/L   Glucose, Bld 114 (H) 65 - 99 mg/dL   BUN 12 6 - 20 mg/dL   Creatinine, Ser 0.73 0.44 - 1.00 mg/dL   Calcium 9.8 8.9 - 10.3 mg/dL   GFR calc non Af Amer >60 >60 mL/min   GFR calc Af Amer >60 >60  mL/min   Anion gap 9 5 - 15  CBC  Result Value Ref Range   WBC 10.1 4.0 - 10.5 K/uL   RBC 4.43 3.87 - 5.11 MIL/uL   Hemoglobin 13.1 12.0 - 15.0 g/dL   HCT 39.4 36.0 - 46.0 %   MCV 88.9 78.0 - 100.0 fL   MCH 29.6 26.0 - 34.0 pg   MCHC 33.2 30.0 - 36.0 g/dL   RDW 13.0 11.5 - 15.5 %   Platelets 232 150 - 400 K/uL  I-stat troponin, ED  Result Value Ref Range   Troponin i, poc 0.00 0.00 - 0.08 ng/mL   Comment 3          I-Stat beta hCG blood, ED  Result Value Ref Range   I-stat hCG, quantitative 6.5 (H) <5 mIU/mL   Comment 3           Dg Chest 2 View  Result Date: 08/11/2017 CLINICAL DATA:  Chest pain and fevers EXAM: CHEST - 2 VIEW COMPARISON:  06/30/2015 FINDINGS: Cardiac shadows within normal limits. The lungs are well aerated bilaterally. No focal infiltrate is seen. No acute bony abnormality is noted. IMPRESSION: No active cardiopulmonary disease. Electronically Signed   By: Inez Catalina M.D.   On: 08/11/2017 21:06   US Breast Ltd Uni Left Inc Axilla  Result Date: 07/27/2017 CLINICAL DATA:  57 year old female presenting for annual bilateral mammogram and delayed follow-up for a probably benign mass in the left breast. EXAM: DIGITAL DIAGNOSTIC BILATERAL MAMMOGRAM WITH IMPLANTS AND TOMO ULTRASOUND LEFT BREAST The patient has retropectoral implants. Standard and implant displaced views were performed. COMPARISON:  Previous exam(s). ACR Breast Density Category b: There are scattered areas of fibroglandular density. FINDINGS: The parenchymal pattern within the bilateral breasts are stable. No suspicious masses or calcifications are identified.  On physical exam, I palpate no focal abnormality within the lateral left breast. There are no overlying skin changes. Targeted ultrasound is performed, showing stable morphology and appearance of an oval, circumscribed hyperechoic mass the 2:30 position 4 cm nipple left. Measurements are 1.0 x 1.0 x 0.4 cm (previously 1.1 x 1.0 x 0.5 cm). IMPRESSION: 1.  Probably benign hyperechoic mass within the left breast demonstrating stable size and morphology since October 2017. Recommendation is for a final follow-up in October 2019 to establish 2 year stability. 2. No mammographic evidence of malignancy in either breast. RECOMMENDATION: Left breast ultrasound in October 2019. I have discussed the findings and recommendations with the patient. Results were also provided in writing at the conclusion of the visit. If applicable, a reminder letter will be sent to the patient regarding the next appointment. BI-RADS CATEGORY  3: Probably benign. Electronically Signed   By: Kristopher Oppenheim M.D.   On: 07/27/2017 11:29   Mm Diag Breast W/implant Tomo Bilateral  Result Date: 07/27/2017 CLINICAL DATA:  57 year old female presenting for annual bilateral mammogram and delayed follow-up for a probably benign mass in the left breast. EXAM: DIGITAL DIAGNOSTIC BILATERAL MAMMOGRAM WITH IMPLANTS AND TOMO ULTRASOUND LEFT BREAST The patient has retropectoral implants. Standard and implant displaced views were performed. COMPARISON:  Previous exam(s). ACR Breast Density Category b: There are scattered areas of fibroglandular density. FINDINGS: The parenchymal pattern within the bilateral breasts are stable. No suspicious masses or calcifications are identified. On physical exam, I palpate no focal abnormality within the lateral left breast. There are no overlying skin changes. Targeted ultrasound is performed, showing stable morphology and appearance of an oval, circumscribed hyperechoic mass the 2:30 position 4 cm nipple left. Measurements are 1.0 x 1.0 x 0.4 cm (previously 1.1 x 1.0 x 0.5 cm). IMPRESSION: 1. Probably benign hyperechoic mass within the left breast demonstrating stable size and morphology since October 2017. Recommendation is for a final follow-up in October 2019 to establish 2 year stability. 2. No mammographic evidence of malignancy in either breast. RECOMMENDATION: Left  breast ultrasound in October 2019. I have discussed the findings and recommendations with the patient. Results were also provided in writing at the conclusion of the visit. If applicable, a reminder letter will be sent to the patient regarding the next appointment. BI-RADS CATEGORY  3: Probably benign. Electronically Signed   By: Kristopher Oppenheim M.D.   On: 07/27/2017 11:29    EKG EKG Interpretation  Date/Time:  Friday Aug 11 2017 20:13:25 EDT Ventricular Rate:  93 PR Interval:    QRS Duration: 97 QT Interval:  348 QTC Calculation: 433 R Axis:   8 Text Interpretation:  Sinus rhythm Anteroseptal infarct, age indeterminate Confirmed by Dory Horn) on 08/12/2017 1:06:04 AM   Radiology Dg Chest 2 View  Result Date: 08/11/2017 CLINICAL DATA:  Chest pain and fevers EXAM: CHEST - 2 VIEW COMPARISON:  06/30/2015 FINDINGS: Cardiac shadows within normal limits. The lungs are well aerated bilaterally. No focal infiltrate is seen. No acute bony abnormality is noted. IMPRESSION: No active cardiopulmonary disease. Electronically Signed   By: Inez Catalina M.D.   On: 08/11/2017 21:06    Procedures Procedures (including critical care time)  Medications Ordered in ED Medications  ketorolac (TORADOL) injection 30 mg (has no administration in time range)  lidocaine (LIDODERM) 5 % 2 patch (has no administration in time range)  methocarbamol (ROBAXIN) tablet 1,000 mg (has no administration in time range)       Final Clinical  Impressions(s) / ED Diagnoses  Will add lidoderm to norco, refer back to orthopedics have recommended ROM of the shoulder and not an immobilizer.    Return for weakness, numbness, changes in vision or speech, fevers >100.4 unrelieved by medication, shortness of breath, intractable vomiting, or diarrhea, abdominal pain, Inability to tolerate liquids or food, cough, altered mental status or any concerns. No signs of systemic illness or infection. The patient is  nontoxic-appearing on exam and vital signs are within normal limits.   I have reviewed the triage vital signs and the nursing notes. Pertinent labs &imaging results that were available during my care of the patient were reviewed by me and considered in my medical decision making (see chart for details).  After history, exam, and medical workup I feel the patient has been appropriately medically screened and is safe for discharge home. Pertinent diagnoses were discussed with the patient. Patient was given return precautions.      Burnard Enis, MD 08/12/17 [phone]

## 2017-08-12 NOTE — ED Notes (Signed)
Pt is c/o of left shoulder pain and has her arm in an immobilizer that she purchased herself. She has a prescription from Dr. Lorin Mercy and an MRI scheduled per Dr. Lorin Mercy.

## 2017-08-15 DIAGNOSIS — L4050 Arthropathic psoriasis, unspecified: Secondary | ICD-10-CM | POA: Insufficient documentation

## 2017-08-22 ENCOUNTER — Ambulatory Visit (INDEPENDENT_AMBULATORY_CARE_PROVIDER_SITE_OTHER): Payer: BLUE CROSS/BLUE SHIELD | Admitting: Orthopaedic Surgery

## 2017-08-28 DIAGNOSIS — M7530 Calcific tendinitis of unspecified shoulder: Secondary | ICD-10-CM | POA: Insufficient documentation

## 2017-08-28 DIAGNOSIS — M25519 Pain in unspecified shoulder: Secondary | ICD-10-CM | POA: Insufficient documentation

## 2017-08-29 ENCOUNTER — Ambulatory Visit (INDEPENDENT_AMBULATORY_CARE_PROVIDER_SITE_OTHER): Payer: BLUE CROSS/BLUE SHIELD | Admitting: Orthopaedic Surgery

## 2017-10-16 DIAGNOSIS — D1720 Benign lipomatous neoplasm of skin and subcutaneous tissue of unspecified limb: Secondary | ICD-10-CM | POA: Insufficient documentation

## 2017-11-30 ENCOUNTER — Encounter (HOSPITAL_COMMUNITY): Payer: Self-pay | Admitting: Emergency Medicine

## 2017-11-30 ENCOUNTER — Emergency Department (HOSPITAL_COMMUNITY)
Admission: EM | Admit: 2017-11-30 | Discharge: 2017-11-30 | Disposition: A | Discharge status: Home / Self Care | Payer: BLUE CROSS/BLUE SHIELD | Attending: Emergency Medicine | Admitting: Emergency Medicine

## 2017-11-30 DIAGNOSIS — R531 Weakness: Secondary | ICD-10-CM

## 2017-11-30 DIAGNOSIS — E876 Hypokalemia: Secondary | ICD-10-CM | POA: Diagnosis not present

## 2017-11-30 DIAGNOSIS — R3129 Other microscopic hematuria: Secondary | ICD-10-CM | POA: Diagnosis not present

## 2017-11-30 DIAGNOSIS — R42 Dizziness and giddiness: Secondary | ICD-10-CM | POA: Diagnosis present

## 2017-11-30 LAB — CBC WITH DIFFERENTIAL/PLATELET
Basophils Absolute: 0 10*3/uL (ref 0.0–0.1)
Basophils Relative: 0 %
EOS PCT: 2 %
Eosinophils Absolute: 0.1 10*3/uL (ref 0.0–0.7)
HCT: 39.9 % (ref 36.0–46.0)
Hemoglobin: 13.4 g/dL (ref 12.0–15.0)
LYMPHS ABS: 1.5 10*3/uL (ref 0.7–4.0)
LYMPHS PCT: 30 %
MCH: 30 pg (ref 26.0–34.0)
MCHC: 33.6 g/dL (ref 30.0–36.0)
MCV: 89.5 fL (ref 78.0–100.0)
MONOS PCT: 14 %
Monocytes Absolute: 0.7 10*3/uL (ref 0.1–1.0)
Neutro Abs: 2.6 10*3/uL (ref 1.7–7.7)
Neutrophils Relative %: 54 %
PLATELETS: 193 10*3/uL (ref 150–400)
RBC: 4.46 MIL/uL (ref 3.87–5.11)
RDW: 13 % (ref 11.5–15.5)
WBC: 4.9 10*3/uL (ref 4.0–10.5)

## 2017-11-30 LAB — I-STAT TROPONIN, ED: Troponin i, poc: 0 ng/mL (ref 0.00–0.08)

## 2017-11-30 LAB — COMPREHENSIVE METABOLIC PANEL
ALT: 12 U/L (ref 0–44)
AST: 16 U/L (ref 15–41)
Albumin: 4.4 g/dL (ref 3.5–5.0)
Alkaline Phosphatase: 92 U/L (ref 38–126)
Anion gap: 10 (ref 5–15)
BUN: 19 mg/dL (ref 6–20)
CHLORIDE: 103 mmol/L (ref 98–111)
CO2: 29 mmol/L (ref 22–32)
CREATININE: 0.85 mg/dL (ref 0.44–1.00)
Calcium: 10.2 mg/dL (ref 8.9–10.3)
GFR calc Af Amer: >60 mL/min (ref >60)
GFR calc non Af Amer: >60 mL/min (ref >60)
Glucose, Bld: 96 mg/dL (ref 70–99)
Potassium: 3.2 mmol/L — ABNORMAL LOW (ref 3.5–5.1)
Sodium: 142 mmol/L (ref 135–145)
Total Bilirubin: 0.6 mg/dL (ref 0.3–1.2)
Total Protein: 7.5 g/dL (ref 6.5–8.1)

## 2017-11-30 LAB — URINALYSIS, ROUTINE W REFLEX MICROSCOPIC
BILIRUBIN URINE: NEGATIVE
Bacteria, UA: NONE SEEN
GLUCOSE, UA: NEGATIVE mg/dL
Ketones, ur: NEGATIVE mg/dL
LEUKOCYTES UA: NEGATIVE
Nitrite: NEGATIVE
PH: 6 (ref 5.0–8.0)
Protein, ur: NEGATIVE mg/dL
Specific Gravity, Urine: 1.012 (ref 1.005–1.030)

## 2017-11-30 LAB — PROTIME-INR
INR: 0.81
Prothrombin Time: 11.2 seconds — ABNORMAL LOW (ref 11.4–15.2)

## 2017-11-30 LAB — CBG MONITORING, ED: GLUCOSE-CAPILLARY: 95 mg/dL (ref 70–99)

## 2017-11-30 MED ORDER — POTASSIUM CHLORIDE CRYS ER 20 MEQ PO TBCR
30.0000 meq | EXTENDED_RELEASE_TABLET | Freq: Once | ORAL | Status: AC
  Administered 2017-11-30: 30 meq via ORAL
  Filled 2017-11-30: qty 1

## 2017-11-30 MED ORDER — ACETAMINOPHEN 500 MG PO TABS
1000.0000 mg | ORAL_TABLET | Freq: Once | ORAL | Status: AC
  Administered 2017-11-30: 1000 mg via ORAL
  Filled 2017-11-30: qty 2

## 2017-11-30 MED ORDER — SODIUM CHLORIDE 0.9 % IV BOLUS: 1000.0000 mL | Freq: Once | INTRAVENOUS | Status: DC

## 2017-11-30 NOTE — ED Triage Notes (Signed)
Pt reports that she has been having dizziness and fatigue for several days.  States that had her HTN medications adjusted on Friday, stopped taking antibiotics yesterday for possible UTI or diverticulitis. Did receive her tetanus and flu shot yesterday at PCP. Reports she hasnt had ETOH in week and trying to quit smoking.

## 2017-11-30 NOTE — Discharge Instructions (Signed)
Please see the information and instructions below regarding your visit.  Your diagnoses today include:  1. Generalized weakness   2. Microscopic hematuria   3. Hypokalemia    Tests performed today include: See side panel of your discharge paperwork for testing performed today. Vital signs are listed at the bottom of these instructions.   Medications prescribed:    Take any prescribed medications only as prescribed, and any over the counter medications only as directed on the packaging.  On testing today, your potassium level was 3.2 which is slightly below normal.  Increasing potassium in your diet should be sufficient to make sure you are getting enough potassium. Below is a list of good sources and servings high in potassium (in order from greatest to least):  -1 cup cooked acorn squash -1 baked potato with skin -1 cup cooked spinach -1 cup cooked lentils -1 cup cooked kidney beans -Watermelon - cup raisins -1 cup plain yogurt -1 cup orange juice, frozen -Banana -1 cup 1% low-fat milk -1 cup cooked broccoli   Home care instructions:  Please follow any educational materials contained in this packet.   Follow-up instructions: Please follow-up with your primary care provider in 5-7 days for further evaluation of your symptoms if they are not completely improved.   Return instructions:  Please return to the Emergency Department if you experience worsening symptoms.  Please return the emergency department if you develop any chest pain, shortness of breath, sensation of room spinning, worsening lightheadedness, or Please return if you have any other emergent concerns.  Additional Information:   Your vital signs today were: BP (!) 149/77 (BP Location: Right Arm)    Pulse 64    Temp 98.8 F (37.1 C) (Oral)    Resp 14    Ht 5\' 4"  (1.626 m)    Wt 67.6 kg    SpO2 98%    BMI 25.58 kg/m  If your blood pressure (BP) was elevated on multiple readings during this visit above 130  for the top number or above 80 for the bottom number, please have this repeated by your primary care provider within one month. --------------  Thank you for allowing [place] to participate in your care today.

## 2017-11-30 NOTE — ED Provider Notes (Signed)
[facility] DEPT Provider Note   CSN: [id number] Arrival date & time: 11/30/17  1359     History   Chief Complaint Chief Complaint  Patient presents with  . Dizziness  . Weakness    HPI Rebecca Espinoza is a 57 y.o. female.  HPI  Patient is a 57 year old female with a history of bipolar 1 disorder, hyperlipidemia, hypertension, acid reflux presenting for lightheadedness, generalized weakness.  Patient reports that over the past 2 weeks, she has had multiple visits to her primary care provider for various medical presentations.  Patient reports last week, she is having some sharp lower abdominal pain, and was placed on amoxicillin for either diverticulitis or urinary tract infection.  Patient reports she is no longer having any symptoms of dysuria, or lower abdominal pain, but over the past 48 hours ago she developed generalized weakness and lightheadedness.  Patient does report that she had a thrombus cytopenic reading with platelets 79 this week, and the more consistently low on redraws that her primary care provider.  Patient denies history of blood disorder.  Patient also reports that he received a flu shot and Tdap shot yesterday, as well as had her blood pressure medication adjusted from 5 mg of lisinopril to 10 mg.  Patient denies recorded fevers, but does report she felt flushed yesterday.  Patient reports history of 3-6 beers per day, and this increased use is been going on for the past 5 months, however she reports that she is a multiple medication secondary to alcohol, so she tries to reduce use.  Patient denies any recent tick bites or recent travel.  Past Medical History:  Diagnosis Date  . Acid reflux   . Arthritis   . Bipolar 1 disorder ([place])   . Depression   . Hyperlipidemia   . Hypertension   . Post traumatic stress disorder   . Psoriasis     Patient Active Problem List   Diagnosis Date Noted  . Calcific tendinitis of left shoulder  08/08/2017  . Hypokalemia- mild 01/03/2016  . Subclinical hypothyroidism 01/03/2016  . History of tobacco use disorder- quit 2/17 12/06/2015  . Status post hysterectomy 12/06/2015  . Bipolar I disorder, most recent episode mixed ([place]) 11/30/2015  . GERD (gastroesophageal reflux disease) 11/30/2015  . Psoriasiform dermatitis 11/30/2015  . Mixed hyperlipidemia 11/30/2015  . History of breast augmentation 11/30/2015  . Chronic fatigue 11/30/2015  . Hypertension, benign essential, goal below 140/90 11/30/2015  . H/O abnormal cervical Papanicolaou smear 11/30/2015  . Memory changes 11/30/2015  . Marijuana smoker, continuous 11/30/2015  . Ache in joint 11/26/2015  . Gen Arthritis 11/26/2015  . Family history of breast cancer in first degree relative 11/26/2015  . Moderate episode of recurrent major depressive disorder ([place]) 11/26/2015  . Acute stress disorder 11/26/2015  . Vitamin D deficiency 11/26/2015    Past Surgical History:  Procedure Laterality Date  . ABDOMINAL HYSTERECTOMY    . AUGMENTATION MAMMAPLASTY Bilateral   . BLADDER REPAIR    . BLADDER SURGERY    . BREAST ENHANCEMENT SURGERY    . RECTAL PROLAPSE REPAIR    . REPAIR OF RECTAL PROLAPSE    . TONSILLECTOMY       OB History    Gravida  5   Para  4   Term  4   Preterm  0   AB  1   Living        SAB  1   TAB  0   Ectopic  0   Multiple      Live Births               Home Medications    Prior to Admission medications   Medication Sig Start Date End Date Taking? Authorizing Provider  buPROPion (WELLBUTRIN SR) 150 MG 12 hr tablet Take 150 mg by mouth daily.  11/08/17  Yes [provider]  hydrochlorothiazide (HYDRODIURIL) 25 MG tablet Take 25 mg by mouth daily.   Yes [provider]  hydrocortisone cream 1 % Apply 1 application topically daily.   Yes [provider]  lamoTRIgine (LAMICTAL) 25 MG tablet Take 75 mg by mouth at bedtime.    Yes [provider]    lisinopril (PRINIVIL,ZESTRIL) 5 MG tablet Take 10 mg by mouth daily.    Yes [provider]  QUEtiapine (SEROQUEL) 100 MG tablet Take 100 mg by mouth at bedtime.   Yes [provider]  sertraline (ZOLOFT) 50 MG tablet Take 50 mg by mouth daily.  11/08/17  Yes [provider]  Diclofenac Sodium CR (VOLTAREN-XR) 100 MG 24 hr tablet Take 1 tablet (100 mg total) by mouth daily. Patient not taking: Reported on 11/30/2017 08/12/17   Palumbo, April, MD  HYDROcodone-acetaminophen (NORCO/VICODIN) 5-325 MG tablet Take 1 tablet by mouth every 8 (eight) hours as needed for moderate pain. Patient not taking: Reported on 11/30/2017 08/08/17   Marybelle Killings, MD  lidocaine (LIDODERM) 5 % Place 1 patch onto the skin daily. Remove & Discard patch within 12 hours or as directed by MD Patient not taking: Reported on 11/30/2017 08/12/17   Randal Buba, April, MD    Family History Family History  Problem Relation Age of Onset  . Breast cancer Mother   . Alcohol abuse Father   . Depression Father     Social History Social History   Tobacco Use  . Smoking status: Current Every Day Smoker    Packs/day: 0.50    Types: Cigarettes  . Smokeless tobacco: Never Used  Substance Use Topics  . Alcohol use: Yes  . Drug use: Yes    Types: Marijuana     Allergies   Fish allergy and Other   Review of Systems Review of Systems  Constitutional: Negative for chills, fever and unexpected weight change.  HENT: Negative for congestion, rhinorrhea, sinus pain and sore throat.   Eyes: Negative for visual disturbance.  Respiratory: Negative for cough, chest tightness and shortness of breath.   Cardiovascular: Negative for chest pain, palpitations and leg swelling.  Gastrointestinal: Negative for abdominal pain, nausea and vomiting.  Genitourinary: Negative for dysuria and flank pain.  Musculoskeletal: Positive for myalgias. Negative for back pain.  Skin: Positive for rash.       +psoriatic rash on  extensor surfaces.  Neurological: Positive for light-headedness. Negative for dizziness, syncope and headaches.     Physical Exam Updated Vital Signs BP (!) 149/77 (BP Location: Right Arm)   Pulse 64   Temp 98.8 F (37.1 C) (Oral)   Resp 14   Ht 5\' 4"  (1.626 m)   Wt 67.6 kg   SpO2 98%   BMI 25.58 kg/m   Physical Exam  Constitutional: She appears well-developed and well-nourished. No distress.  HENT:  Head: Normocephalic and atraumatic.  Mouth/Throat: Oropharynx is clear and moist.  Eyes: Pupils are equal, round, and reactive to light. Conjunctivae and EOM are normal.  Neck: Normal range of motion. Neck supple.  Cardiovascular: Normal rate, regular rhythm, S1 normal and S2  normal.  No murmur heard. Pulmonary/Chest: Effort normal and breath sounds normal. She has no wheezes. She has no rales.  Abdominal: Soft. She exhibits no distension. There is no tenderness. There is no guarding.  Musculoskeletal: Normal range of motion. She exhibits no edema or deformity.  Neurological: She is alert.  Cranial nerves grossly intact. Patient moves extremities symmetrically and with good coordination. Normal and symmetric gait.  Skin: Skin is warm and dry. Rash noted. No erythema.  Small patches of erythema with overlying scale on bilateral upper extremity extensor surfaces.  Psychiatric: She has a normal mood and affect. Her behavior is normal. Judgment and thought content normal.  Nursing note and vitals reviewed.    ED Treatments / Results  Labs (all labs ordered are listed, but only abnormal results are displayed) Labs Reviewed  COMPREHENSIVE METABOLIC PANEL - Abnormal; Notable for the following components:      Result Value   Potassium 3.2 (*)    All other components within normal limits  PROTIME-INR - Abnormal; Notable for the following components:   Prothrombin Time 11.2 (*)    All other components within normal limits  URINALYSIS, ROUTINE W REFLEX MICROSCOPIC - Abnormal;  Notable for the following components:   Color, Urine STRAW (*)    Hgb urine dipstick MODERATE (*)    All other components within normal limits  CBC WITH DIFFERENTIAL/PLATELET  CBC WITH DIFFERENTIAL/PLATELET  CBG MONITORING, ED  I-STAT TROPONIN, ED    EKG None  Radiology No results found.  Procedures Procedures (including critical care time)  Medications Ordered in ED Medications  sodium chloride 0.9 % bolus 1,000 mL (has no administration in time range)     Initial Impression / Assessment and Plan / ED Course  I have reviewed the triage vital signs and the nursing notes.  Pertinent labs & imaging results that were available during my care of the patient were reviewed by me and considered in my medical decision making (see chart for details).  Clinical Course as of Nov 30 1828  Thu Nov 30, 2017  1814 Will discuss with pt. Needs OP follow up.  Hgb urine dipstickMarland Kitchen): MODERATE [AM]  Anton with laboratory technician, who confirms that patient had clotted platelets, but that her sample was able to read.  Platelets: 193 [AM]    Clinical Course User Index [AM] Albesa Seen, PA-C    Patient is nontoxic-appearing, afebrile, no acute distress.  Patient without focal signs or symptoms on evaluation.  Doubt cardiac source of patient's dizziness that she has normal EKG negative troponin.  Doubt neurologic cause of patient's symptoms, she is having no focal weakness, numbness, vertigo, disorientation or confusion.  Patient has no leukocytosis or other abnormality on CBC.  Patient had reported thrombus cytopenia and her primary care provider office, however it was normal today at 193, and reviewed with the lab that platelets were clumped, possibly accounting for laboratory error.  At this time, suspicious for patient's symptoms caused by alteration in her antihypertensives, as dose was recently doubled the last 48 hours.  Additionally, patient did receive flu shot yesterday,  which may create cytokine release.  Patient was referred back to primary care provider for recheck as well as further management of blood pressure medications.  Patient was given return precautions any chest pain, shortness breath, weakness or numbness, worsening lightheadedness or vertigo, or any new or worsening symptoms.  Patient is in understanding and agrees with the plan of care.  This is a supervised visit  with Dr. Julianne Rice. Evaluation, management, and discharge planning discussed with this attending physician.  Final Clinical Impressions(s) / ED Diagnoses   Final diagnoses:  Generalized weakness  Microscopic hematuria    ED Discharge Orders    None       Tamala Julian 12/01/17 0140    Julianne Rice, MD 12/01/17 [phone]

## 2017-12-27 ENCOUNTER — Ambulatory Visit
Admission: RE | Admit: 2017-12-27 | Discharge: 2017-12-27 | Disposition: A | Discharge status: Home / Self Care | Payer: BLUE CROSS/BLUE SHIELD | Source: Ambulatory Visit | Attending: Nurse Practitioner | Admitting: Nurse Practitioner

## 2017-12-27 DIAGNOSIS — N6320 Unspecified lump in the left breast, unspecified quadrant: Secondary | ICD-10-CM

## 2017-12-28 ENCOUNTER — Other Ambulatory Visit: Payer: PRIVATE HEALTH INSURANCE

## 2019-01-10 ENCOUNTER — Other Ambulatory Visit: Payer: Self-pay | Admitting: *Deleted

## 2019-08-21 DIAGNOSIS — M545 Low back pain, unspecified: Secondary | ICD-10-CM | POA: Insufficient documentation

## 2019-09-01 ENCOUNTER — Encounter (HOSPITAL_COMMUNITY): Payer: Self-pay | Admitting: Emergency Medicine

## 2019-09-01 ENCOUNTER — Other Ambulatory Visit: Payer: Self-pay

## 2019-09-01 ENCOUNTER — Emergency Department (HOSPITAL_COMMUNITY)
Admission: EM | Admit: 2019-09-01 | Discharge: 2019-09-01 | Disposition: A | Discharge status: Home / Self Care | Payer: 59 | Attending: Emergency Medicine | Admitting: Emergency Medicine

## 2019-09-01 DIAGNOSIS — Z79899 Other long term (current) drug therapy: Secondary | ICD-10-CM | POA: Insufficient documentation

## 2019-09-01 DIAGNOSIS — Z91013 Allergy to seafood: Secondary | ICD-10-CM | POA: Diagnosis not present

## 2019-09-01 DIAGNOSIS — Y999 Unspecified external cause status: Secondary | ICD-10-CM | POA: Insufficient documentation

## 2019-09-01 DIAGNOSIS — I10 Essential (primary) hypertension: Secondary | ICD-10-CM | POA: Diagnosis not present

## 2019-09-01 DIAGNOSIS — F17210 Nicotine dependence, cigarettes, uncomplicated: Secondary | ICD-10-CM | POA: Insufficient documentation

## 2019-09-01 DIAGNOSIS — Z791 Long term (current) use of non-steroidal anti-inflammatories (NSAID): Secondary | ICD-10-CM | POA: Insufficient documentation

## 2019-09-01 DIAGNOSIS — Y92094 Garage of other non-institutional residence as the place of occurrence of the external cause: Secondary | ICD-10-CM | POA: Diagnosis not present

## 2019-09-01 DIAGNOSIS — F1290 Cannabis use, unspecified, uncomplicated: Secondary | ICD-10-CM | POA: Diagnosis not present

## 2019-09-01 DIAGNOSIS — Z23 Encounter for immunization: Secondary | ICD-10-CM | POA: Diagnosis not present

## 2019-09-01 DIAGNOSIS — W2209XA Striking against other stationary object, initial encounter: Secondary | ICD-10-CM | POA: Insufficient documentation

## 2019-09-01 DIAGNOSIS — S0101XA Laceration without foreign body of scalp, initial encounter: Secondary | ICD-10-CM | POA: Insufficient documentation

## 2019-09-01 DIAGNOSIS — Y9301 Activity, walking, marching and hiking: Secondary | ICD-10-CM | POA: Diagnosis not present

## 2019-09-01 DIAGNOSIS — E039 Hypothyroidism, unspecified: Secondary | ICD-10-CM | POA: Diagnosis not present

## 2019-09-01 DIAGNOSIS — S0990XA Unspecified injury of head, initial encounter: Secondary | ICD-10-CM | POA: Diagnosis present

## 2019-09-01 MED ORDER — TETANUS-DIPHTH-ACELL PERTUSSIS 5-2.5-18.5 LF-MCG/0.5 IM SUSP
0.5000 mL | Freq: Once | INTRAMUSCULAR | Status: AC
  Administered 2019-09-01: 0.5 mL via INTRAMUSCULAR
  Filled 2019-09-01: qty 0.5

## 2019-09-01 MED ORDER — LIDOCAINE-EPINEPHRINE (PF) 2 %-1:200000 IJ SOLN
INTRAMUSCULAR | Status: AC
  Filled 2019-09-01: qty 20

## 2019-09-01 NOTE — ED Provider Notes (Signed)
[facility] DEPT Provider Note   CSN: [id number] Arrival date & time: 09/01/19  1518     History Chief Complaint  Patient presents with   Head Injury    Rebecca Espinoza is a 59 y.o. female.  The history is provided by the patient. No language interpreter was used.  Head Injury  Rebecca Espinoza is a 59 y.o. female who presents to the Emergency Department complaining of head injury. She presents the emergency department for evaluation of laceration to her head that she sustained just prior to ED arrival. She states that she was walking into her carport and forgot to duck her head and struck her head across the middle of the carport. She experienced immediate pain and bleeding. She denies any nausea, vomiting, loss of consciousness, vision changes. Symptoms are moderate and constant nature. She did drink two beers today. She drinks regularly. She does not take any blood thinners. Last tetanus shot is unknown.    Past Medical History:  Diagnosis Date   Acid reflux    Arthritis    Bipolar 1 disorder ([place])    Depression    Hyperlipidemia    Hypertension    Post traumatic stress disorder    Psoriasis     Patient Active Problem List   Diagnosis Date Noted   Calcific tendinitis of left shoulder 08/08/2017   Hypokalemia- mild 01/03/2016   Subclinical hypothyroidism 01/03/2016   History of tobacco use disorder- quit 2/17 12/06/2015   Status post hysterectomy 12/06/2015   Bipolar I disorder, most recent episode mixed ([place]) 11/30/2015   GERD (gastroesophageal reflux disease) 11/30/2015   Psoriasiform dermatitis 11/30/2015   Mixed hyperlipidemia 11/30/2015   History of breast augmentation 11/30/2015   Chronic fatigue 11/30/2015   Hypertension, benign essential, goal below 140/90 11/30/2015   H/O abnormal cervical Papanicolaou smear 11/30/2015   Memory changes 11/30/2015   Marijuana smoker, continuous 11/30/2015   Ache in joint  11/26/2015   Gen Arthritis 11/26/2015   Family history of breast cancer in first degree relative 11/26/2015   Moderate episode of recurrent major depressive disorder ([place]) 11/26/2015   Acute stress disorder 11/26/2015   Vitamin D deficiency 11/26/2015    Past Surgical History:  Procedure Laterality Date   ABDOMINAL HYSTERECTOMY     AUGMENTATION MAMMAPLASTY Bilateral    BLADDER REPAIR     BLADDER SURGERY     BREAST ENHANCEMENT SURGERY     RECTAL PROLAPSE REPAIR     REPAIR OF RECTAL PROLAPSE     TONSILLECTOMY       OB History    Gravida  5   Para  4   Term  4   Preterm  0   AB  1   Living        SAB  1   TAB  0   Ectopic  0   Multiple      Live Births              Family History  Problem Relation Age of Onset   Breast cancer Mother    Alcohol abuse Father    Depression Father     Social History   Tobacco Use   Smoking status: Current Every Day Smoker    Packs/day: 0.50    Types: Cigarettes   Smokeless tobacco: Never Used  Vaping Use   Vaping Use: Never used  Substance Use Topics   Alcohol use: Yes   Drug use: Yes    Types: Marijuana  Home Medications Prior to Admission medications   Medication Sig Start Date End Date Taking? Authorizing Provider  buPROPion (WELLBUTRIN SR) 150 MG 12 hr tablet Take 150 mg by mouth daily.  11/08/17   [provider]  Diclofenac Sodium CR (VOLTAREN-XR) 100 MG 24 hr tablet Take 1 tablet (100 mg total) by mouth daily. Patient not taking: Reported on 11/30/2017 08/12/17   Palumbo, April, MD  hydrochlorothiazide (HYDRODIURIL) 25 MG tablet Take 25 mg by mouth daily.    [provider]  HYDROcodone-acetaminophen (NORCO/VICODIN) 5-325 MG tablet Take 1 tablet by mouth every 8 (eight) hours as needed for moderate pain. Patient not taking: Reported on 11/30/2017 08/08/17   Marybelle Killings, MD  hydrocortisone cream 1 % Apply 1 application topically daily.    [provider]    lamoTRIgine (LAMICTAL) 25 MG tablet Take 75 mg by mouth at bedtime.     [provider]  lidocaine (LIDODERM) 5 % Place 1 patch onto the skin daily. Remove & Discard patch within 12 hours or as directed by MD Patient not taking: Reported on 11/30/2017 08/12/17   Palumbo, April, MD  lisinopril (PRINIVIL,ZESTRIL) 5 MG tablet Take 10 mg by mouth daily.     [provider]  QUEtiapine (SEROQUEL) 100 MG tablet Take 100 mg by mouth at bedtime.    [provider]  sertraline (ZOLOFT) 50 MG tablet Take 50 mg by mouth daily.  11/08/17   [provider]    Allergies    Fish allergy and Other  Review of Systems   Review of Systems  All other systems reviewed and are negative.   Physical Exam Updated Vital Signs BP (!) 160/99    Pulse 93    Temp 98.8 F (37.1 C) (Oral)    Resp (!) 22    SpO2 96%   Physical Exam Vitals and nursing note reviewed.  Constitutional:      Appearance: She is well-developed.  HENT:     Head: Normocephalic.     Comments: There is a 4 1/2 cm laceration over the vertex scalp. Eyes:     Extraocular Movements: Extraocular movements intact.     Pupils: Pupils are equal, round, and reactive to light.  Cardiovascular:     Rate and Rhythm: Normal rate and regular rhythm.  Pulmonary:     Effort: Pulmonary effort is normal. No respiratory distress.  Abdominal:     Tenderness: There is no rebound.  Musculoskeletal:        General: No tenderness.     Cervical back: Neck supple.  Skin:    General: Skin is warm and dry.  Neurological:     Mental Status: She is alert and oriented to person, place, and time.     Comments: No asymmetry of facial movements. Five out of five strength in all four extremities with sensation to light touch intact in all four extremities  Psychiatric:        Behavior: Behavior normal.     ED Results / Procedures / Treatments   Labs (all labs ordered are listed, but only abnormal results are displayed) Labs  Reviewed - No data to display  EKG None  Radiology No results found.  Procedures .Marland KitchenLaceration Repair  Date/Time: 09/01/2019 5:23 PM Performed by: Quintella Reichert, MD Authorized by: Quintella Reichert, MD   Consent:    Consent obtained:  Verbal   Consent given by:  Patient   Risks discussed:  Infection and pain Anesthesia (see MAR for exact  dosages):    Anesthesia method:  Local infiltration   Local anesthetic:  Lidocaine 1% WITH epi Laceration details:    Location:  Scalp   Scalp location:  Crown   Length (cm):  4.5 Repair type:    Repair type:  Simple Exploration:    Wound exploration: entire depth of wound probed and visualized     Contaminated: no   Treatment:    Area cleansed with:  Shur-Clens   Amount of cleaning:  Standard Skin repair:    Repair method:  Staples   Number of staples:  4 Approximation:    Approximation:  Close Post-procedure details:    Dressing:  Open (no dressing)   Patient tolerance of procedure:  Tolerated well, no immediate complications   (including critical care time)  Medications Ordered in ED Medications  lidocaine-EPINEPHrine (XYLOCAINE W/EPI) 2 %-1:200000 (PF) injection (has no administration in time range)  Tdap (BOOSTRIX) injection 0.5 mL (has no administration in time range)    ED Course  I have reviewed the triage vital signs and the nursing notes.  Pertinent labs & imaging results that were available during my care of the patient were reviewed by me and considered in my medical decision making (see chart for details).    MDM Rules/Calculators/A&P                         Patient here for evaluation of injuries following a head injury that occurred prior to arrival. She is non-toxic appearing on evaluation with no focal neurologic deficits. Presentation is not consistent with serious closed head injury. Wound cleaned and repaired per note. Discussed with patient home care following scalp laceration. Discussed outpatient  follow-up as well as return precautions.  Final Clinical Impression(s) / ED Diagnoses Final diagnoses:  Laceration of scalp, initial encounter    Rx / DC Orders ED Discharge Orders    None       Quintella Reichert, MD 09/01/19 1724

## 2019-09-01 NOTE — ED Triage Notes (Signed)
Patient reports hitting head on metal carport today. Denies fall and LOC. Reports "drinking a few beers." Laceration to the top head. Bleeding controlled. Denies neck and back pain. Denies taking blood thinner.

## 2019-09-06 ENCOUNTER — Ambulatory Visit: Payer: Self-pay

## 2019-09-06 NOTE — Telephone Encounter (Signed)
Pt. In Ed 09/01/19 for head laceration and staples placed. Asking questions about concussions. Still having "some headache, but I'm resting a lot.My husband is keeping an eye on me." Using Tylenol for headaches. No other symptoms other than "being tired." Will continue to rest and monitor symptoms. Answer Assessment - Initial Assessment Questions 1. LOCATION: "Where does it hurt?"      Left side crown 2. ONSET: "When did the headache start?" (Minutes, hours or days)      5 days ago 3. PATTERN: "Does the pain come and go, or has it been constant since it started?"     Comes and goes 4. SEVERITY: "How bad is the pain?" and "What does it keep you from doing?"  (e.g., Scale 1-10; mild, moderate, or severe)   - MILD (1-3): doesn't interfere with normal activities    - MODERATE (4-7): interferes with normal activities or awakens from sleep    - SEVERE (8-10): excruciating pain, unable to do any normal activities        Mild 5. RECURRENT SYMPTOM: "Have you ever had headaches before?" If Yes, ask: "When was the last time?" and "What happened that time?"      Had a head injury 6. CAUSE: "What do you think is causing the headache?"     Injury 7. MIGRAINE: "Have you been diagnosed with migraine headaches?" If Yes, ask: "Is this headache similar?"      Yes 8. HEAD INJURY: "Has there been any recent injury to the head?"      Yes 9. OTHER SYMPTOMS: "Do you have any other symptoms?" (fever, stiff neck, eye pain, sore throat, cold symptoms)     No 10. PREGNANCY: "Is there any chance you are pregnant?" "When was your last menstrual period?"       No  Protocols used: HEADACHE-A-AH

## 2019-09-13 ENCOUNTER — Other Ambulatory Visit: Payer: Self-pay

## 2019-09-13 ENCOUNTER — Ambulatory Visit
Admission: EM | Admit: 2019-09-13 | Discharge: 2019-09-13 | Disposition: A | Discharge status: Home / Self Care | Payer: 59

## 2019-09-13 DIAGNOSIS — Z4802 Encounter for removal of sutures: Secondary | ICD-10-CM

## 2019-09-13 NOTE — ED Triage Notes (Addendum)
Patient is here today for staple removal. Patient states area has healed fine, no drainage, irritation or infection.  Patient denies: fever  Originally 4 staples were placed - 4 staples were removed.

## 2019-11-28 ENCOUNTER — Ambulatory Visit
Admission: EM | Admit: 2019-11-28 | Discharge: 2019-11-28 | Disposition: A | Discharge status: Home / Self Care | Payer: 59 | Attending: Family Medicine | Admitting: Family Medicine

## 2019-11-28 ENCOUNTER — Encounter: Payer: Self-pay | Admitting: Emergency Medicine

## 2019-11-28 ENCOUNTER — Ambulatory Visit (INDEPENDENT_AMBULATORY_CARE_PROVIDER_SITE_OTHER): Payer: 59

## 2019-11-28 ENCOUNTER — Other Ambulatory Visit: Payer: Self-pay

## 2019-11-28 DIAGNOSIS — M795 Residual foreign body in soft tissue: Secondary | ICD-10-CM | POA: Diagnosis not present

## 2019-11-28 DIAGNOSIS — W458XXA Other foreign body or object entering through skin, initial encounter: Secondary | ICD-10-CM | POA: Diagnosis not present

## 2019-11-28 DIAGNOSIS — S6992XA Unspecified injury of left wrist, hand and finger(s), initial encounter: Secondary | ICD-10-CM

## 2019-11-28 MED ORDER — AMOXICILLIN-POT CLAVULANATE 875-125 MG PO TABS
1.0000 | ORAL_TABLET | Freq: Two times a day (BID) | ORAL | 0 refills | Status: DC
Start: 2019-11-28 — End: 2020-01-28

## 2019-11-28 NOTE — Discharge Instructions (Signed)
RICE: rest, ice, compression, elevation as needed for pain.   Cold therapy (ice packs) can be used to help swelling both after injury and after prolonged use of areas of chronic pain/aches.  Pain medication:  350 mg-1000 mg of Tylenol (acetaminophen) and/or 200 mg - 800 mg of Advil (ibuprofen, Motrin) every 8 hours as needed.  May alternate between the two throughout the day as they are generally safe to take together.  DO NOT exceed more than 3000 mg of Tylenol or 3200 mg of ibuprofen in a 24 hour period as this could damage your stomach, kidneys, liver, or increase your bleeding risk.  Important to follow up with specialist(s) below for further evaluation/management if your symptoms persist or worsen.

## 2019-11-28 NOTE — ED Triage Notes (Signed)
Patient states she was using a drill this morning and the drill bit went through her left hand.

## 2019-11-28 NOTE — ED Provider Notes (Signed)
[facility]    CSN: [id number] Arrival date & time: 11/28/19  1128      History   Chief Complaint Chief Complaint  Patient presents with  . Extremity Laceration    left hand    HPI Rebecca Espinoza is a 59 y.o. female  Presenting for left hand puncture wound.  Was using a drill bit this morning that went through her hand.  Denies numbness, deformity, prolonged bleeding.  Applied pressure to stop bleeding.  Tetanus up-to-date.  Past Medical History:  Diagnosis Date  . Acid reflux   . Arthritis   . Bipolar 1 disorder ([place])   . Depression   . Hyperlipidemia   . Hypertension   . Post traumatic stress disorder   . Psoriasis     Patient Active Problem List   Diagnosis Date Noted  . Calcific tendinitis of left shoulder 08/08/2017  . Hypokalemia- mild 01/03/2016  . Subclinical hypothyroidism 01/03/2016  . History of tobacco use disorder- quit 2/17 12/06/2015  . Status post hysterectomy 12/06/2015  . Bipolar I disorder, most recent episode mixed ([place]) 11/30/2015  . GERD (gastroesophageal reflux disease) 11/30/2015  . Psoriasiform dermatitis 11/30/2015  . Mixed hyperlipidemia 11/30/2015  . History of breast augmentation 11/30/2015  . Chronic fatigue 11/30/2015  . Hypertension, benign essential, goal below 140/90 11/30/2015  . H/O abnormal cervical Papanicolaou smear 11/30/2015  . Memory changes 11/30/2015  . Marijuana smoker, continuous 11/30/2015  . Ache in joint 11/26/2015  . Gen Arthritis 11/26/2015  . Family history of breast cancer in first degree relative 11/26/2015  . Moderate episode of recurrent major depressive disorder ([place]) 11/26/2015  . Acute stress disorder 11/26/2015  . Vitamin D deficiency 11/26/2015    Past Surgical History:  Procedure Laterality Date  . ABDOMINAL HYSTERECTOMY    . AUGMENTATION MAMMAPLASTY Bilateral   . BLADDER REPAIR    . BLADDER SURGERY    . BREAST ENHANCEMENT SURGERY    . RECTAL PROLAPSE REPAIR    . REPAIR OF RECTAL  PROLAPSE    . TONSILLECTOMY      OB History    Gravida  5   Para  4   Term  4   Preterm  0   AB  1   Living        SAB  1   TAB  0   Ectopic  0   Multiple      Live Births               Home Medications    Prior to Admission medications   Medication Sig Start Date End Date Taking? Authorizing Provider  buPROPion (WELLBUTRIN SR) 150 MG 12 hr tablet Take 150 mg by mouth daily.  11/08/17  Yes [provider]  hydrochlorothiazide (HYDRODIURIL) 25 MG tablet Take 25 mg by mouth daily.   Yes [provider]  lamoTRIgine (LAMICTAL) 25 MG tablet Take 75 mg by mouth at bedtime.    Yes [provider]  lisinopril (PRINIVIL,ZESTRIL) 5 MG tablet Take 10 mg by mouth daily.    Yes [provider]  QUEtiapine (SEROQUEL) 100 MG tablet Take 100 mg by mouth at bedtime.   Yes [provider]  sertraline (ZOLOFT) 50 MG tablet Take 50 mg by mouth daily.  11/08/17  Yes [provider]  amoxicillin-clavulanate (AUGMENTIN) 875-125 MG tablet Take 1 tablet by mouth every 12 (twelve) hours. 11/28/19   Hall-Potvin, [place], PA-C    Family History Family History  Problem Relation  Age of Onset  . Breast cancer Mother   . Alcohol abuse Father   . Depression Father     Social History Social History   Tobacco Use  . Smoking status: Current Every Day Smoker    Packs/day: 0.50    Types: Cigarettes  . Smokeless tobacco: Never Used  Vaping Use  . Vaping Use: Never used  Substance Use Topics  . Alcohol use: Yes  . Drug use: Yes    Types: Marijuana     Allergies   Fish allergy and Other   Review of Systems As per HPI   Physical Exam Triage Vital Signs ED Triage Vitals  Enc Vitals Group     BP 11/28/19 1151 (!) 162/87     Pulse Rate 11/28/19 1151 78     Resp 11/28/19 1151 18     Temp 11/28/19 1151 98 F (36.7 C)     Temp Source 11/28/19 1151 Oral     SpO2 11/28/19 1151 98 %     Weight 11/28/19 1152 140 lb (63.5 kg)       Height 11/28/19 1152 5\' 5"  (1.651 m)     Head Circumference --      Peak Flow --      Pain Score 11/28/19 1152 10     Pain Loc --      Pain Edu? --      Excl. in [place]? --    No data found.  Updated Vital Signs BP (!) 162/87 (BP Location: Right Arm)   Pulse 78   Temp 98 F (36.7 C) (Oral)   Resp 18   Ht 5\' 5"  (1.651 m)   Wt 140 lb (63.5 kg)   SpO2 98%   BMI 23.30 kg/m   Visual Acuity Right Eye Distance:   Left Eye Distance:   Bilateral Distance:    Right Eye Near:   Left Eye Near:    Bilateral Near:     Physical Exam Constitutional:      General: She is not in acute distress. HENT:     Head: Normocephalic and atraumatic.  Eyes:     General: No scleral icterus.    Pupils: Pupils are equal, round, and reactive to light.  Cardiovascular:     Rate and Rhythm: Normal rate.  Pulmonary:     Effort: Pulmonary effort is normal.  Musculoskeletal:        General: Tenderness present. No swelling. Normal range of motion.     Comments: Left hand with puncture wound between second and third digit.  Neurovascularly intact  Skin:    Capillary Refill: Capillary refill takes less than 2 seconds.     Coloration: Skin is not jaundiced or pale.  Neurological:     Mental Status: She is alert and oriented to person, place, and time.      UC Treatments / Results  Labs (all labs ordered are listed, but only abnormal results are displayed) Labs Reviewed - No data to display  EKG   Radiology DG Hand Complete Left  Result Date: 11/28/2019 CLINICAL DATA:  Dural that went through LEFT hand between the second and third fingers. EXAM: LEFT HAND - COMPLETE 3+ VIEW COMPARISON:  January 09, 2017 FINDINGS: Osteopenia. No acute fracture or dislocation. Moderate degenerative changes of the first [facility] with osseous remodeling and an unchanged well corticated ossific density along of the ulnar aspect of the joint. Scattered mild degenerative changes of the DIPs. No area of erosion or osseous  destruction. No  unexpected metallic densities. There are several punctate radiopaque flecks at the site of reported injury between the second there is at the level of the MCPs. Soft tissue edema. IMPRESSION: 1. No acute fracture or dislocation 2. Punctate radiopaque flecks at the site of reported injury between the second and third MCPs. These may reflect a small amount of debris. No retained metallic fragments visualized. Correlate with physical exam. Electronically Signed   By: Valentino Saxon MD   On: 11/28/2019 12:24    Procedures Procedures (including critical care time)  Medications Ordered in UC Medications - No data to display  Initial Impression / Assessment and Plan / UC Course  I have reviewed the triage vital signs and the nursing notes.  Pertinent labs & imaging results that were available during my care of the patient were reviewed by me and considered in my medical decision making (see chart for details).     Bleeding controlled prior to arrival.  X-ray negative for fracture or foreign body.  Reviewed supportive care as outlined below, provided contact information for hand.  Given penetrating puncture wound, will start Augmentin.  Return precautions discussed, pt verbalized understanding and is agreeable to plan. Final Clinical Impressions(s) / UC Diagnoses   Final diagnoses:  Hand injury, left, initial encounter     Discharge Instructions     RICE: rest, ice, compression, elevation as needed for pain.   Cold therapy (ice packs) can be used to help swelling both after injury and after prolonged use of areas of chronic pain/aches.  Pain medication:  350 mg-1000 mg of Tylenol (acetaminophen) and/or 200 mg - 800 mg of Advil (ibuprofen, Motrin) every 8 hours as needed.  May alternate between the two throughout the day as they are generally safe to take together.  DO NOT exceed more than 3000 mg of Tylenol or 3200 mg of ibuprofen in a 24 hour period as this could damage  your stomach, kidneys, liver, or increase your bleeding risk.  Important to follow up with specialist(s) below for further evaluation/management if your symptoms persist or worsen.    ED Prescriptions    Medication Sig Dispense Auth. Provider   amoxicillin-clavulanate (AUGMENTIN) 875-125 MG tablet Take 1 tablet by mouth every 12 (twelve) hours. 14 tablet Hall-Potvin, [place], PA-C     PDMP not reviewed this encounter.   Hall-Potvin, [place], [place] 11/28/19 1254

## 2019-12-25 ENCOUNTER — Other Ambulatory Visit: Payer: Self-pay | Admitting: Licensed Clinical Social Worker

## 2019-12-25 ENCOUNTER — Other Ambulatory Visit: Payer: Self-pay | Admitting: *Deleted

## 2019-12-26 ENCOUNTER — Other Ambulatory Visit: Payer: Self-pay | Admitting: *Deleted

## 2019-12-26 DIAGNOSIS — R5381 Other malaise: Secondary | ICD-10-CM

## 2020-01-01 ENCOUNTER — Encounter: Payer: Self-pay | Admitting: Gastroenterology

## 2020-01-01 ENCOUNTER — Other Ambulatory Visit: Payer: Self-pay | Admitting: Family Medicine

## 2020-01-01 DIAGNOSIS — T8542XS Displacement of breast prosthesis and implant, sequela: Secondary | ICD-10-CM

## 2020-01-08 ENCOUNTER — Other Ambulatory Visit: Payer: Self-pay | Admitting: Family Medicine

## 2020-01-08 DIAGNOSIS — Z1231 Encounter for screening mammogram for malignant neoplasm of breast: Secondary | ICD-10-CM

## 2020-01-22 ENCOUNTER — Other Ambulatory Visit: Payer: Self-pay

## 2020-01-22 ENCOUNTER — Ambulatory Visit (AMBULATORY_SURGERY_CENTER): Payer: Self-pay

## 2020-01-22 VITALS — Ht 65.0 in | Wt 140.0 lb

## 2020-01-22 DIAGNOSIS — Z1211 Encounter for screening for malignant neoplasm of colon: Secondary | ICD-10-CM

## 2020-01-22 DIAGNOSIS — Z01818 Encounter for other preprocedural examination: Secondary | ICD-10-CM

## 2020-01-22 MED ORDER — NA SULFATE-K SULFATE-MG SULF 17.5-3.13-1.6 GM/177ML PO SOLN: 1.0000 | Freq: Once | ORAL | 0 refills | Status: AC

## 2020-01-22 NOTE — Progress Notes (Signed)
No allergies to soy or egg Pt is not on blood thinners or diet pills Denies issues with sedation/intubation Denies atrial flutter/fib Denies constipation   Emmi instructions given to pt  Pt is aware of Covid safety and care partner requirements.  

## 2020-01-28 ENCOUNTER — Encounter: Payer: Self-pay | Admitting: Family Medicine

## 2020-01-28 ENCOUNTER — Ambulatory Visit (INDEPENDENT_AMBULATORY_CARE_PROVIDER_SITE_OTHER): Payer: 59

## 2020-01-28 ENCOUNTER — Ambulatory Visit
Admission: EM | Admit: 2020-01-28 | Discharge: 2020-01-28 | Disposition: A | Discharge status: Home / Self Care | Payer: 59 | Attending: Family Medicine | Admitting: Family Medicine

## 2020-01-28 DIAGNOSIS — M25572 Pain in left ankle and joints of left foot: Secondary | ICD-10-CM

## 2020-01-28 DIAGNOSIS — S93492A Sprain of other ligament of left ankle, initial encounter: Secondary | ICD-10-CM

## 2020-01-28 MED ORDER — TRAMADOL HCL 50 MG PO TABS: 50.0000 mg | ORAL_TABLET | Freq: Four times a day (QID) | ORAL | 0 refills | Status: DC | PRN

## 2020-01-28 NOTE — Discharge Instructions (Addendum)
Usually this kind of sprain takes 10 days to 2 weeks to heal.  If pain persists, please return or follow up with your primary care physician

## 2020-01-28 NOTE — ED Triage Notes (Signed)
Pt states fell backwards off a step wrong yesterday. States landed on her buttock. Denies hitting her head. Pt c/o lt ankle pain, swelling, and bruising.

## 2020-01-28 NOTE — ED Provider Notes (Signed)
[facility]    CSN: [id number] Arrival date & time: 01/28/20  1314      History   Chief Complaint Chief Complaint  Patient presents with  . Ankle Pain    HPI Rebecca Espinoza is a 59 y.o. female.   Established [facility] patient presenting with ankle pain.   Pt states fell backwards off a step wrong yesterday. States landed on her buttock. Denies hitting her head. Pt c/o lt ankle pain, swelling, and bruising.   Hips and leg are asymptomatic now.  No head injury.  The left ankle continues to throb despite rest and ice.  Able to bear weight.     Past Medical History:  Diagnosis Date  . Acid reflux   . Arthritis   . Bipolar 1 disorder ([place])   . Depression   . Hyperlipidemia   . Hypertension   . Post traumatic stress disorder   . Psoriasis   . Sleep apnea    no cpap    Patient Active Problem List   Diagnosis Date Noted  . Calcific tendinitis of left shoulder 08/08/2017  . Hypokalemia- mild 01/03/2016  . Subclinical hypothyroidism 01/03/2016  . History of tobacco use disorder- quit 2/17 12/06/2015  . Status post hysterectomy 12/06/2015  . Bipolar I disorder, most recent episode mixed ([place]) 11/30/2015  . GERD (gastroesophageal reflux disease) 11/30/2015  . Psoriasiform dermatitis 11/30/2015  . Mixed hyperlipidemia 11/30/2015  . History of breast augmentation 11/30/2015  . Chronic fatigue 11/30/2015  . Hypertension, benign essential, goal below 140/90 11/30/2015  . H/O abnormal cervical Papanicolaou smear 11/30/2015  . Memory changes 11/30/2015  . Marijuana smoker, continuous 11/30/2015  . Ache in joint 11/26/2015  . Gen Arthritis 11/26/2015  . Family history of breast cancer in first degree relative 11/26/2015  . Moderate episode of recurrent major depressive disorder ([place]) 11/26/2015  . Acute stress disorder 11/26/2015  . Vitamin D deficiency 11/26/2015    Past Surgical History:  Procedure Laterality Date  . ABDOMINAL HYSTERECTOMY    . AUGMENTATION  MAMMAPLASTY Bilateral   . BLADDER REPAIR    . BLADDER SURGERY    . BREAST ENHANCEMENT SURGERY    . RECTAL PROLAPSE REPAIR    . REPAIR OF RECTAL PROLAPSE    . TONSILLECTOMY      OB History    Gravida  5   Para  4   Term  4   Preterm  0   AB  1   Living        SAB  1   TAB  0   Ectopic  0   Multiple      Live Births               Home Medications    Prior to Admission medications   Medication Sig Start Date End Date Taking? Authorizing Provider  buPROPion (WELLBUTRIN SR) 150 MG 12 hr tablet Take 150 mg by mouth daily.  11/08/17   [provider]  hydrochlorothiazide (HYDRODIURIL) 25 MG tablet Take 25 mg by mouth daily.    [provider]  lamoTRIgine (LAMICTAL) 25 MG tablet Take 75 mg by mouth at bedtime.     [provider]  lisinopril (PRINIVIL,ZESTRIL) 5 MG tablet Take 10 mg by mouth daily.     [provider]  QUEtiapine (SEROQUEL) 100 MG tablet Take 100 mg by mouth at bedtime.    [provider]  sertraline (ZOLOFT) 50 MG tablet Take 50 mg by mouth daily.  11/08/17   [provider]  traMADol (ULTRAM) 50 MG tablet Take 1 tablet (50 mg total) by mouth every 6 (six) hours as needed. 01/28/20   Robyn Haber, MD  VITAMIN D PO Take by mouth.    [provider]    Family History Family History  Problem Relation Age of Onset  . Breast cancer Mother   . Alcohol abuse Father   . Depression Father   . Colon cancer Maternal Grandfather   . Colon polyps Neg Hx   . Esophageal cancer Neg Hx   . Stomach cancer Neg Hx   . Rectal cancer Neg Hx     Social History Social History   Tobacco Use  . Smoking status: Current Every Day Smoker    Packs/day: 0.50    Types: Cigarettes  . Smokeless tobacco: Never Used  Vaping Use  . Vaping Use: Never used  Substance Use Topics  . Alcohol use: Yes    Alcohol/week: 3.0 - 4.0 standard drinks    Types: 3 - 4 Cans of beer per week  . Drug use: Yes     Types: Marijuana    Comment: Daily     Allergies   Fish allergy and Other   Review of Systems Review of Systems  Musculoskeletal: Positive for gait problem and joint swelling.  All other systems reviewed and are negative.    Physical Exam Triage Vital Signs ED Triage Vitals  Enc Vitals Group     BP      Pulse      Resp      Temp      Temp src      SpO2      Weight      Height      Head Circumference      Peak Flow      Pain Score      Pain Loc      Pain Edu?      Excl. in [place]?    No data found.  Updated Vital Signs BP (!) 165/82 (BP Location: Left Arm)   Pulse 70   Temp 98.5 F (36.9 C) (Oral)   Resp 18   SpO2 97%    Physical Exam Vitals and nursing note reviewed.  Constitutional:      General: She is not in acute distress.    Appearance: Normal appearance. She is normal weight. She is not ill-appearing.  HENT:     Head: Normocephalic.     Nose: Nose normal.  Eyes:     Conjunctiva/sclera: Conjunctivae normal.  Cardiovascular:     Rate and Rhythm: Normal rate.  Pulmonary:     Effort: Pulmonary effort is normal.  Musculoskeletal:        General: Swelling, tenderness and signs of injury present. No deformity. Normal range of motion.     Cervical back: Normal range of motion and neck supple.     Comments: Left ankle mildly swollen without ecchymosis.  Markedly tender posterior distal fibula.  Skin:    General: Skin is warm and dry.  Neurological:     General: No focal deficit present.     Mental Status: She is alert.  Psychiatric:        Mood and Affect: Mood normal.      UC Treatments / Results  Labs (all labs ordered are listed, but only abnormal results are displayed) Labs Reviewed - No data to display  EKG   Radiology No results found.  Procedures  Procedures (including critical care time)  Medications Ordered in UC Medications - No data to display  Initial Impression / Assessment and Plan / UC Course  I have reviewed the  triage vital signs and the nursing notes.  Pertinent labs & imaging results that were available during my care of the patient were reviewed by me and considered in my medical decision making (see chart for details).    Final Clinical Impressions(s) / UC Diagnoses   Final diagnoses:  Sprain of anterior talofibular ligament of left ankle, initial encounter     Discharge Instructions     Usually this kind of sprain takes 10 days to 2 weeks to heal.  If pain persists, please return or follow up with your primary care physician    ED Prescriptions    Medication Sig Dispense Auth. Provider   traMADol (ULTRAM) 50 MG tablet Take 1 tablet (50 mg total) by mouth every 6 (six) hours as needed. 10 tablet Robyn Haber, MD     I have reviewed the PDMP during this encounter.   Robyn Haber, MD 01/28/20 1536

## 2020-01-31 ENCOUNTER — Telehealth: Payer: Self-pay | Admitting: Gastroenterology

## 2020-01-31 NOTE — Telephone Encounter (Signed)
Okay thanks for letting me know

## 2020-01-31 NOTE — Telephone Encounter (Signed)
Hi Dr. Havery Moros, this pt called to cancel her procedure that was scheduled for next Wednesday 11/24 because she woke up with high fever this morning and prefers to wait until she feels better. She will call back to reschedule. Thank you.

## 2020-02-05 ENCOUNTER — Encounter: Payer: 59 | Admitting: Gastroenterology

## 2020-02-13 ENCOUNTER — Ambulatory Visit: Payer: 59

## 2020-03-12 ENCOUNTER — Telehealth: Payer: Self-pay | Admitting: Gastroenterology

## 2020-03-12 NOTE — Telephone Encounter (Signed)
Pt Is requesting a call back from a nurse to discuss scheduling her for a COVID test prior to her colonoscopy.

## 2020-03-16 NOTE — Telephone Encounter (Signed)
Pt had covid 01-2020 , with the antibody infusion - colon 03-24-2020  Will not need Covid test prior to 1-11 Colon   Pt is asymptomatic currently for covid   We went over prep instructions for her procedure 03-24-2020  when to DC foods, when to drink both preps and stop drinking plus arrival time

## 2020-03-24 ENCOUNTER — Other Ambulatory Visit: Payer: Self-pay

## 2020-03-24 ENCOUNTER — Encounter: Payer: Self-pay | Admitting: Gastroenterology

## 2020-03-24 ENCOUNTER — Ambulatory Visit (AMBULATORY_SURGERY_CENTER): Payer: 59 | Admitting: Gastroenterology

## 2020-03-24 VITALS — BP 132/59 | HR 61 | Temp 98.7°F | Resp 14 | Ht 65.0 in | Wt 140.0 lb

## 2020-03-24 DIAGNOSIS — D123 Benign neoplasm of transverse colon: Secondary | ICD-10-CM

## 2020-03-24 DIAGNOSIS — Z1211 Encounter for screening for malignant neoplasm of colon: Secondary | ICD-10-CM | POA: Diagnosis present

## 2020-03-24 DIAGNOSIS — D120 Benign neoplasm of cecum: Secondary | ICD-10-CM

## 2020-03-24 DIAGNOSIS — D122 Benign neoplasm of ascending colon: Secondary | ICD-10-CM

## 2020-03-24 MED ORDER — SODIUM CHLORIDE 0.9 % IV SOLN: 500.0000 mL | INTRAVENOUS | Status: DC

## 2020-03-24 NOTE — Patient Instructions (Signed)
Read all of the handouts given to you by your recovery room nurse. ? ?YOU HAD AN ENDOSCOPIC PROCEDURE TODAY AT THE [facility]:   Refer to the procedure report that was given to you for any specific questions about what was found during the examination.  If the procedure report does not answer your questions, please call your gastroenterologist to clarify.  If you requested that your care partner not be given the details of your procedure findings, then the procedure report has been included in a sealed envelope for you to review at your convenience later. ? ?YOU SHOULD EXPECT: Some feelings of bloating in the abdomen. Passage of more gas than usual.  Walking can help get rid of the air that was put into your GI tract during the procedure and reduce the bloating. If you had a lower endoscopy (such as a colonoscopy or flexible sigmoidoscopy) you may notice spotting of blood in your stool or on the toilet paper. If you underwent a bowel prep for your procedure, you may not have a normal bowel movement for a few days. ? ?Please Note:  You might notice some irritation and congestion in your nose or some drainage.  This is from the oxygen used during your procedure.  There is no need for concern and it should clear up in a day or so. ? ?SYMPTOMS TO REPORT IMMEDIATELY: ? ?Following lower endoscopy (colonoscopy or flexible sigmoidoscopy): ? Excessive amounts of blood in the stool ? Significant tenderness or worsening of abdominal pains ? Swelling of the abdomen that is new, acute ? Fever of 100?F or higher ? ?  ?For urgent or emergent issues, a gastroenterologist can be reached at any hour by calling [phone]. ?Do not use MyChart messaging for urgent concerns.  ? ? ?DIET:  We do recommend a small meal at first, but then you may proceed to your regular diet.  Drink plenty of fluids but you should avoid alcoholic beverages for 24 hours. ? ?ACTIVITY:  You should plan to take it easy for the rest of today  and you should NOT DRIVE or use heavy machinery until tomorrow (because of the sedation medicines used during the test).   ? ?FOLLOW UP: ?Our staff will call the number listed on your records 48-72 hours following your procedure to check on you and address any questions or concerns that you may have regarding the information given to you following your procedure. If we do not reach you, we will leave a message.  We will attempt to reach you two times.  During this call, we will ask if you have developed any symptoms of COVID 19. If you develop any symptoms (ie: fever, flu-like symptoms, shortness of breath, cough etc.) before then, please call [phone].  If you test positive for Covid 19 in the 2 weeks post procedure, please call and report this information to us.   ? ?If any biopsies were taken you will be contacted by phone or by letter within the next 1-3 weeks.  Please call us at [phone] if you have not heard about the biopsies in 3 weeks.  ? ? ?SIGNATURES/CONFIDENTIALITY: ?You and/or your care partner have signed paperwork which will be entered into your electronic medical record.  These signatures attest to the fact that that the information above on your After Visit Summary has been reviewed and is understood.  Full responsibility of the confidentiality of this discharge information lies with you and/or your care-partner.  ?

## 2020-03-24 NOTE — Op Note (Signed)
[facility] Patient Name: Novis Kempe Procedure Date: 03/24/2020 3:45 PM MRN: [id number] Endoscopist: Remo Lipps P. Havery Moros , MD Age: 60 Referring MD:  Date of Birth: June 15, 1960 Gender: Female Account #: [id number] Procedure:                Colonoscopy Indications:              Screening for colorectal malignant neoplasm Medicines:                Monitored Anesthesia Care Procedure:                Pre-Anesthesia Assessment:                           - Prior to the procedure, a History and Physical                            was performed, and patient medications and                            allergies were reviewed. The patient's tolerance of                            previous anesthesia was also reviewed. The risks                            and benefits of the procedure and the sedation                            options and risks were discussed with the patient.                            All questions were answered, and informed consent                            was obtained. Prior Anticoagulants: The patient has                            taken no previous anticoagulant or antiplatelet                            agents. ASA Grade Assessment: II - A patient with                            mild systemic disease. After reviewing the risks                            and benefits, the patient was deemed in                            satisfactory condition to undergo the procedure.                           After obtaining informed consent, the colonoscope  was passed under direct vision. Throughout the                            procedure, the patient's blood pressure, pulse, and                            oxygen saturations were monitored continuously. The                            Olympus PCF-H190DL (#[id number]) Colonoscope was                            introduced through the anus and advanced to the the                            cecum,  identified by appendiceal orifice and                            ileocecal valve. The colonoscopy was performed                            without difficulty. The patient tolerated the                            procedure well. The quality of the bowel                            preparation was good. The ileocecal valve,                            appendiceal orifice, and rectum were photographed. Scope In: 3:54:32 PM Scope Out: 8:75:64 PM Scope Withdrawal Time: 0 hours 17 minutes 18 seconds  Total Procedure Duration: 0 hours 22 minutes 27 seconds  Findings:                 The perianal and digital rectal examinations were                            normal.                           A 4 mm polyp was found in the cecum. The polyp was                            flat. The polyp was removed with a cold snare.                            Resection and retrieval were complete.                           A 5 mm polyp was found in the ileocecal valve. The                            polyp was flat. The polyp was removed with  a cold                            snare. Resection and retrieval were complete.                           A 8 mm polyp was found in the ascending colon. The                            polyp was flat. The polyp was removed with a cold                            snare. Resection and retrieval were complete.                           A 4 to 5 mm polyp was found in the transverse                            colon. The polyp was sessile. The polyp was removed                            with a cold snare. Resection and retrieval were                            complete.                           The exam was otherwise without abnormality. Complications:            No immediate complications. Estimated blood loss:                            Minimal. Estimated Blood Loss:     Estimated blood loss was minimal. Impression:               - One 4 mm polyp in the cecum, removed with a cold                             snare. Resected and retrieved.                           - One 5 mm polyp at the ileocecal valve, removed                            with a cold snare. Resected and retrieved.                           - One 8 mm polyp in the ascending colon, removed                            with a cold snare. Resected and retrieved.                           - One 4 to 5 mm polyp  in the transverse colon,                            removed with a cold snare. Resected and retrieved.                           - The examination was otherwise normal. Recommendation:           - Patient has a contact number available for                            emergencies. The signs and symptoms of potential                            delayed complications were discussed with the                            patient. Return to normal activities tomorrow.                            Written discharge instructions were provided to the                            patient.                           - Resume previous diet.                           - Continue present medications.                           - Await pathology results. Remo Lipps P. Armbruster, MD 03/24/2020 4:21:54 PM This report has been signed electronically.

## 2020-03-24 NOTE — Progress Notes (Signed)
pt tolerated well. VSS. awake and to recovery. Report given to RN.  

## 2020-03-26 ENCOUNTER — Telehealth: Payer: Self-pay | Admitting: *Deleted

## 2020-03-26 ENCOUNTER — Telehealth: Payer: Self-pay

## 2020-03-26 NOTE — Telephone Encounter (Signed)
  Follow up Call-  Call back number 03/24/2020  Post procedure Call Back phone  # [phone]  Permission to leave phone message Yes  Some recent data might be hidden     Patient questions:  Do you have a fever, pain , or abdominal swelling? No. Pain Score  0 *  Have you tolerated food without any problems? Yes.    Have you been able to return to your normal activities? Yes.    Do you have any questions about your discharge instructions: Diet   No. Medications  No. Follow up visit  No.  Do you have questions or concerns about your Care? No.  Actions: * If pain score is 4 or above: 1. No action needed, pain <4.Have you developed a fever since your procedure? no  2.   Have you had an respiratory symptoms (SOB or cough) since your procedure? no  3.   Have you tested positive for COVID 19 since your procedure no  4.   Have you had any family members/close contacts diagnosed with the COVID 19 since your procedure?  no   If yes to any of these questions please route to Joylene John, RN and Joella Prince, RN

## 2020-03-26 NOTE — Telephone Encounter (Signed)
No answer for post procedure follow up call. Left message and will call back later today.

## 2021-04-10 ENCOUNTER — Ambulatory Visit: Admission: EM | Admit: 2021-04-10 | Discharge: 2021-04-10 | Discharge status: Absent without leave | Payer: Self-pay

## 2021-08-22 ENCOUNTER — Other Ambulatory Visit: Payer: Self-pay

## 2021-08-22 ENCOUNTER — Encounter (HOSPITAL_COMMUNITY): Payer: Self-pay

## 2021-08-22 ENCOUNTER — Emergency Department (HOSPITAL_COMMUNITY)
Admission: EM | Admit: 2021-08-22 | Discharge: 2021-08-22 | Disposition: A | Discharge status: Home / Self Care | Payer: 59 | Attending: Emergency Medicine | Admitting: Emergency Medicine

## 2021-08-22 ENCOUNTER — Emergency Department (HOSPITAL_COMMUNITY): Payer: 59

## 2021-08-22 DIAGNOSIS — N3001 Acute cystitis with hematuria: Secondary | ICD-10-CM | POA: Insufficient documentation

## 2021-08-22 DIAGNOSIS — R112 Nausea with vomiting, unspecified: Secondary | ICD-10-CM | POA: Diagnosis not present

## 2021-08-22 DIAGNOSIS — R197 Diarrhea, unspecified: Secondary | ICD-10-CM | POA: Insufficient documentation

## 2021-08-22 DIAGNOSIS — F1020 Alcohol dependence, uncomplicated: Secondary | ICD-10-CM | POA: Insufficient documentation

## 2021-08-22 DIAGNOSIS — R1084 Generalized abdominal pain: Secondary | ICD-10-CM | POA: Diagnosis not present

## 2021-08-22 DIAGNOSIS — E876 Hypokalemia: Secondary | ICD-10-CM | POA: Diagnosis not present

## 2021-08-22 DIAGNOSIS — Z79899 Other long term (current) drug therapy: Secondary | ICD-10-CM | POA: Diagnosis not present

## 2021-08-22 DIAGNOSIS — R109 Unspecified abdominal pain: Secondary | ICD-10-CM

## 2021-08-22 DIAGNOSIS — F1090 Alcohol use, unspecified, uncomplicated: Secondary | ICD-10-CM

## 2021-08-22 LAB — CBC
HCT: 40.1 % (ref 36.0–46.0)
Hemoglobin: 13.4 g/dL (ref 12.0–15.0)
MCH: 30.6 pg (ref 26.0–34.0)
MCHC: 33.4 g/dL (ref 30.0–36.0)
MCV: 91.6 fL (ref 80.0–100.0)
Platelets: DECREASED 10*3/uL (ref 150–400)
RBC: 4.38 MIL/uL (ref 3.87–5.11)
RDW: 13.2 % (ref 11.5–15.5)
WBC: 6.3 10*3/uL (ref 4.0–10.5)
nRBC: 0 % (ref 0.0–0.2)

## 2021-08-22 LAB — COMPREHENSIVE METABOLIC PANEL
ALT: 17 U/L (ref 0–44)
AST: 20 U/L (ref 15–41)
Albumin: 4 g/dL (ref 3.5–5.0)
Alkaline Phosphatase: 105 U/L (ref 38–126)
Anion gap: 11 (ref 5–15)
BUN: 11 mg/dL (ref 8–23)
CO2: 24 mmol/L (ref 22–32)
Calcium: 9.4 mg/dL (ref 8.9–10.3)
Chloride: 105 mmol/L (ref 98–111)
Creatinine, Ser: 0.96 mg/dL (ref 0.44–1.00)
GFR, Estimated: >60 mL/min (ref >60)
Glucose, Bld: 111 mg/dL — ABNORMAL HIGH (ref 70–99)
Potassium: 3.2 mmol/L — ABNORMAL LOW (ref 3.5–5.1)
Sodium: 140 mmol/L (ref 135–145)
Total Bilirubin: 0.4 mg/dL (ref 0.3–1.2)
Total Protein: 6.8 g/dL (ref 6.5–8.1)

## 2021-08-22 LAB — URINALYSIS, ROUTINE W REFLEX MICROSCOPIC
Bilirubin Urine: NEGATIVE
Glucose, UA: NEGATIVE mg/dL
Ketones, ur: NEGATIVE mg/dL
Nitrite: NEGATIVE
Protein, ur: NEGATIVE mg/dL
Specific Gravity, Urine: 1.02 (ref 1.005–1.030)
pH: 5 (ref 5.0–8.0)

## 2021-08-22 LAB — LIPASE, BLOOD: Lipase: 32 U/L (ref 11–51)

## 2021-08-22 MED ORDER — IOHEXOL 300 MG/ML  SOLN
100.0000 mL | Freq: Once | INTRAMUSCULAR | Status: AC | PRN
  Administered 2021-08-22: 100 mL via INTRAVENOUS

## 2021-08-22 MED ORDER — CHLORDIAZEPOXIDE HCL 25 MG PO CAPS: ORAL_CAPSULE | ORAL | 0 refills | Status: DC

## 2021-08-22 MED ORDER — SODIUM CHLORIDE 0.9 % IV BOLUS
1000.0000 mL | Freq: Once | INTRAVENOUS | Status: AC
  Administered 2021-08-22: 1000 mL via INTRAVENOUS

## 2021-08-22 MED ORDER — POTASSIUM CHLORIDE CRYS ER 20 MEQ PO TBCR
40.0000 meq | EXTENDED_RELEASE_TABLET | Freq: Once | ORAL | Status: AC
  Administered 2021-08-22: 40 meq via ORAL
  Filled 2021-08-22: qty 2

## 2021-08-22 MED ORDER — FOLIC ACID 1 MG PO TABS
1.0000 mg | ORAL_TABLET | Freq: Every day | ORAL | Status: DC
  Administered 2021-08-22: 1 mg via ORAL
  Filled 2021-08-22: qty 1

## 2021-08-22 MED ORDER — ONDANSETRON HCL 4 MG/2ML IJ SOLN
4.0000 mg | Freq: Once | INTRAMUSCULAR | Status: AC
  Administered 2021-08-22: 4 mg via INTRAVENOUS
  Filled 2021-08-22: qty 2

## 2021-08-22 MED ORDER — THIAMINE HCL 100 MG/ML IJ SOLN
100.0000 mg | Freq: Every day | INTRAMUSCULAR | Status: DC
  Administered 2021-08-22: 100 mg via INTRAVENOUS
  Filled 2021-08-22: qty 2

## 2021-08-22 MED ORDER — CEPHALEXIN 500 MG PO CAPS: 500.0000 mg | ORAL_CAPSULE | Freq: Three times a day (TID) | ORAL | 0 refills | Status: AC

## 2021-08-22 MED ORDER — CEPHALEXIN 500 MG PO CAPS
500.0000 mg | ORAL_CAPSULE | Freq: Once | ORAL | Status: DC
  Filled 2021-08-22: qty 1

## 2021-08-22 NOTE — ED Provider Notes (Signed)
[facility] DEPT Provider Note   CSN: [id number] Arrival date & time: 08/22/21  0998     History  Chief Complaint  Patient presents with   Abdominal Pain    Rebecca Espinoza is a 61 y.o. female.  Pt is a 61 yo female with pmh of of bipolar 1 disorder and depression presenting for generalized abdominal pain, abdominal swelling, diarrhea, and vomiting x several days. Admits to daily drinking x 6 days. States she recently cut back down to 3-4 beers a day + 1-2 extra beers on the weekends. States she is not ready to stop drinking at this time. States she will follow up with primary care when ready.   The history is provided by the patient. No language interpreter was used.  Abdominal Pain Associated symptoms: diarrhea, nausea and vomiting   Associated symptoms: no chest pain, no chills, no cough, no dysuria, no fever, no hematuria, no shortness of breath and no sore throat        Home Medications Prior to Admission medications   Medication Sig Start Date End Date Taking? Authorizing Provider  buPROPion (WELLBUTRIN SR) 150 MG 12 hr tablet Take 150 mg by mouth daily.  11/08/17   [provider]  hydrochlorothiazide (HYDRODIURIL) 25 MG tablet Take 25 mg by mouth daily.    [provider]  lamoTRIgine (LAMICTAL) 25 MG tablet Take 75 mg by mouth at bedtime.     [provider]  lisinopril (PRINIVIL,ZESTRIL) 5 MG tablet Take 10 mg by mouth daily.     [provider]  QUEtiapine (SEROQUEL) 100 MG tablet Take 100 mg by mouth at bedtime.    [provider]  sertraline (ZOLOFT) 50 MG tablet Take 50 mg by mouth daily.  11/08/17   [provider]  traMADol (ULTRAM) 50 MG tablet Take 1 tablet (50 mg total) by mouth every 6 (six) hours as needed. Patient not taking: Reported on 03/24/2020 01/28/20   Robyn Haber, MD  VITAMIN D PO Take by mouth.    [provider]      Allergies    Fish allergy and Other     Review of Systems   Review of Systems  Constitutional:  Negative for chills and fever.  HENT:  Negative for ear pain and sore throat.   Eyes:  Negative for pain and visual disturbance.  Respiratory:  Negative for cough and shortness of breath.   Cardiovascular:  Negative for chest pain and palpitations.  Gastrointestinal:  Positive for abdominal pain, diarrhea, nausea and vomiting.  Genitourinary:  Negative for dysuria and hematuria.  Musculoskeletal:  Negative for arthralgias and back pain.  Skin:  Negative for color change and rash.  Neurological:  Negative for seizures and syncope.  All other systems reviewed and are negative.   Physical Exam Updated Vital Signs BP 139/74   Pulse 66   Temp 98.2 F (36.8 C) (Oral)   Resp 18   Ht '5\' 5"'$  (1.651 m)   Wt 63.5 kg   SpO2 98%   BMI 23.30 kg/m  Physical Exam Vitals and nursing note reviewed.  Constitutional:      General: She is not in acute distress.    Appearance: She is well-developed.  HENT:     Head: Normocephalic and atraumatic.  Eyes:     Conjunctiva/sclera: Conjunctivae normal.  Cardiovascular:     Rate and Rhythm: Normal rate and regular rhythm.     Heart sounds: No murmur heard. Pulmonary:  Effort: Pulmonary effort is normal. No respiratory distress.     Breath sounds: Normal breath sounds.  Abdominal:     Palpations: Abdomen is soft.     Tenderness: There is generalized abdominal tenderness. There is no guarding or rebound.  Musculoskeletal:        General: No swelling.     Cervical back: Neck supple.  Skin:    General: Skin is warm and dry.     Capillary Refill: Capillary refill takes less than 2 seconds.  Neurological:     Mental Status: She is alert.  Psychiatric:        Mood and Affect: Mood normal.     ED Results / Procedures / Treatments   Labs (all labs ordered are listed, but only abnormal results are displayed) Labs Reviewed  COMPREHENSIVE METABOLIC PANEL - Abnormal; Notable for the  following components:      Result Value   Potassium 3.2 (*)    Glucose, Bld 111 (*)    All other components within normal limits  URINALYSIS, ROUTINE W REFLEX MICROSCOPIC - Abnormal; Notable for the following components:   Hgb urine dipstick MODERATE (*)    Leukocytes,Ua MODERATE (*)    Bacteria, UA RARE (*)    All other components within normal limits  LIPASE, BLOOD  CBC  ALBUMIN    EKG None  Radiology CT ABDOMEN PELVIS W CONTRAST  Result Date: 08/22/2021 CLINICAL DATA:  Nausea, vomiting, and abdominal pain. Abdominal swelling and diarrhea. Reports drinking 4-6 beers a day for 6 years. EXAM: CT ABDOMEN AND PELVIS WITH CONTRAST TECHNIQUE: Multidetector CT imaging of the abdomen and pelvis was performed using the standard protocol following bolus administration of intravenous contrast. RADIATION DOSE REDUCTION: This exam was performed according to the departmental dose-optimization program which includes automated exposure control, adjustment of the mA and/or kV according to patient size and/or use of iterative reconstruction technique. CONTRAST:  168m OMNIPAQUE IOHEXOL 300 MG/ML  SOLN COMPARISON:  03/21/2017 FINDINGS: Lower chest: No acute abnormality. Hepatobiliary: No focal liver abnormality is seen. No gallstones, gallbladder wall thickening, or biliary dilatation. Pancreas: Unremarkable. No pancreatic ductal dilatation or surrounding inflammatory changes. Spleen: Normal in size without focal abnormality. Adrenals/Urinary Tract: Normal adrenal glands. No nephrolithiasis, hydronephrosis or mass identified. Urinary bladder is unremarkable. Stomach/Bowel: Stomach is nondistended. The appendix is visualized and is normal. Colon is diffusely decompressed. No signs of bowel wall thickening, inflammation, or distension. Distal colonic diverticula noted without signs of acute inflammation. Vascular/Lymphatic: Mild aortic atherosclerosis. No aneurysm. No signs abdominopelvic adenopathy. Reproductive:  Status post hysterectomy. No adnexal masses. Other: No free fluid or fluid collection. No signs of pneumoperitoneum. Musculoskeletal: No acute or significant osseous findings. Degenerative disc disease identified at L4-5 and L5-S1. IMPRESSION: 1. No acute findings within the abdomen or pelvis. 2. Distal colonic diverticulosis without signs of acute inflammation. 3. Aortic Atherosclerosis (ICD10-I70.0). Electronically Signed   By: TKerby MoorsM.D.   On: 08/22/2021 09:58    Procedures Procedures    Medications Ordered in ED Medications  thiamine (B-1) injection 100 mg (100 mg Intravenous Given 69/76/7314193  folic acid (FOLVITE) tablet 1 mg (1 mg Oral Given 08/22/21 1253)  cephALEXin (KEFLEX) capsule 500 mg (has no administration in time range)  iohexol (OMNIPAQUE) 300 MG/ML solution 100 mL (100 mLs Intravenous Contrast Given 08/22/21 0920)  potassium chloride SA (KLOR-CON M) CR tablet 40 mEq (40 mEq Oral Given 08/22/21 1253)  sodium chloride 0.9 % bolus 1,000 mL (1,000 mLs Intravenous New Bag/Given 08/22/21  1254)  ondansetron (ZOFRAN) injection 4 mg (4 mg Intravenous Given 08/22/21 1253)    ED Course/ Medical Decision Making/ A&P                           Medical Decision Making Amount and/or Complexity of Data Reviewed Labs: ordered. Radiology: ordered.  Risk Prescription drug management.   61 yo female with pmh of of bipolar 1 disorder and depression presenting for generalized abdominal pain, abdominal swelling, diarrhea, and vomiting x several days.  Patient is alert and oriented x3, no acute distress, afebrile, stable vital signs.  Physical exam demonstrates a soft nontender abdomen.  Laboratory studies demonstrate stable liver profile, lipase, and renal function.  Zofran given for symptomatic management of nausea.  The fluids given for diarrhea and likely dehydration.  Thiamine and folic acid given for history of alcohol abuse.   CT abdomen demonstrates no acute process.  Urine  mildly positive for urinary tract infection will treat with Keflex.  Potassium low and replaced.  I spent at least 10 minutes discussing the dangers of call abuse and encouraged them to quit.  Patient encouraged to attend AA meetings, follow-up with primary care physician for further management, and lithium sent to pharmacy.  Patient highly encouraged not to start this medication unless she is ready to stop drinking.  Patient in no distress and overall condition improved here in the ED. Detailed discussions were had with the patient regarding current findings, and need for close f/u with PCP or on call doctor. The patient has been instructed to return immediately if the symptoms worsen in any way for re-evaluation. Patient verbalized understanding and is in agreement with current care plan. All questions answered prior to discharge.         Final Clinical Impression(s) / ED Diagnoses Final diagnoses:  Alcohol use disorder  Abdominal pain, unspecified abdominal location  Diarrhea, unspecified type  Hypokalemia  Acute cystitis with hematuria    Rx / DC Orders ED Discharge Orders     None         Lianne Cure, DO 56/21/30 1315

## 2021-08-22 NOTE — ED Triage Notes (Signed)
Ambulatory to ED with c/o abd swelling, vomiting, and diarrhea. Endorses 4-6 beers a day x 6 years.

## 2021-08-22 NOTE — Discharge Instructions (Addendum)
Librium sent to your pharmacy.  Please only take this medication if you are determined to stop drinking at this time.  If for any reason you change your mind please stop taking Librium.  It can cause sedating effects, even more so when combined with alcohol, and can be very dangerous.  Medication will help with tremors, nausea, anxiety, insomnia, and seizure disorder seen with alcohol withdrawal symptoms.  Your urine analysis also demonstrated a urinary tract infection.  Prescription for antibiotics Keflex sent to your pharmacy.  Take 3 times a day for the next 7 days.

## 2021-11-20 ENCOUNTER — Ambulatory Visit: Payer: 59

## 2021-11-25 DIAGNOSIS — M25572 Pain in left ankle and joints of left foot: Secondary | ICD-10-CM | POA: Insufficient documentation

## 2021-11-25 DIAGNOSIS — G8929 Other chronic pain: Secondary | ICD-10-CM | POA: Insufficient documentation

## 2022-04-12 DIAGNOSIS — I10 Essential (primary) hypertension: Secondary | ICD-10-CM | POA: Diagnosis not present

## 2022-04-12 DIAGNOSIS — Z1231 Encounter for screening mammogram for malignant neoplasm of breast: Secondary | ICD-10-CM | POA: Diagnosis not present

## 2022-04-12 DIAGNOSIS — E785 Hyperlipidemia, unspecified: Secondary | ICD-10-CM | POA: Diagnosis not present

## 2022-04-12 DIAGNOSIS — E559 Vitamin D deficiency, unspecified: Secondary | ICD-10-CM | POA: Diagnosis not present

## 2022-04-12 DIAGNOSIS — F319 Bipolar disorder, unspecified: Secondary | ICD-10-CM | POA: Diagnosis not present

## 2022-04-12 DIAGNOSIS — R4189 Other symptoms and signs involving cognitive functions and awareness: Secondary | ICD-10-CM | POA: Diagnosis not present

## 2022-04-12 DIAGNOSIS — Z0001 Encounter for general adult medical examination with abnormal findings: Secondary | ICD-10-CM | POA: Diagnosis not present

## 2022-04-14 DIAGNOSIS — R928 Other abnormal and inconclusive findings on diagnostic imaging of breast: Secondary | ICD-10-CM | POA: Diagnosis not present

## 2022-05-05 ENCOUNTER — Telehealth: Payer: Self-pay

## 2022-05-05 NOTE — Telephone Encounter (Signed)
Mychart msg sent

## 2022-05-12 DIAGNOSIS — Z0001 Encounter for general adult medical examination with abnormal findings: Secondary | ICD-10-CM | POA: Diagnosis not present

## 2022-05-16 DIAGNOSIS — F319 Bipolar disorder, unspecified: Secondary | ICD-10-CM | POA: Diagnosis not present

## 2022-07-04 ENCOUNTER — Ambulatory Visit
Admission: EM | Admit: 2022-07-04 | Discharge: 2022-07-04 | Disposition: A | Discharge status: Home / Self Care | Payer: Medicaid Other | Attending: Emergency Medicine | Admitting: Emergency Medicine

## 2022-07-04 DIAGNOSIS — R682 Dry mouth, unspecified: Secondary | ICD-10-CM | POA: Diagnosis not present

## 2022-07-04 MED ORDER — NYSTATIN 100000 UNIT/ML MT SUSP: 500000.0000 [IU] | Freq: Four times a day (QID) | OROMUCOSAL | 0 refills | Status: DC

## 2022-07-04 NOTE — Discharge Instructions (Signed)
Consider talking with a therapist again as you work toward making some changes in your life.   You will get a call if tests are abnormal, you will not get a call if tests are normal but you can check results in MyChart if you have a MyChart account.

## 2022-07-04 NOTE — ED Triage Notes (Signed)
Pt c/o chronic dry mouth and over the last 2 months says it has been very sore. Concerned for thrush. Has used peroxide and water without relief.

## 2022-07-04 NOTE — ED Provider Notes (Signed)
[facility]    CSN: [id number] Arrival date & time: 07/04/22  1530      History   Chief Complaint Chief Complaint  Patient presents with   sore tongue    HPI Rebecca Espinoza is a 62 y.o. female. She reports dry mouth for an unknown amount of time, maybe 2 months, she can't remember. It hasn't been a problem for a year, but she doesn't remember when it started. Her tongue recently had a white coat to it so she worried she could have thrush. Started using salt water and peroxide mouth rinses but it hasn't helped, now tongue is a bit sore and mouth is still dry. Has subclinical hypothyroidism listed in hx - doesn't know last time thyroid was checked. Admits she does have dry skin, dry mouth, fatigue, feels her hair is falling out, is prone to constipation. Is concerned about how much pot she is smoking - feels she is carrying around a lot of hurt and is using pot to cover it. Used to drink heavily but stopped and replaced drinking with smoking pot. Does have hx of depression, bipolar disorder; sees mental health regularly to check in about meds, but does not have a therapist currently. Would like to stop smoking pot. Feels meds for depression/bipolar working well for her.   HPI  Past Medical History:  Diagnosis Date   Acid reflux    Arthritis    Bipolar 1 disorder    Depression    Hyperlipidemia    Hypertension    Post traumatic stress disorder    Psoriasis    Sleep apnea    no cpap    Patient Active Problem List   Diagnosis Date Noted   Calcific tendinitis of left shoulder 08/08/2017   Hypokalemia- mild 01/03/2016   Subclinical hypothyroidism 01/03/2016   History of tobacco use disorder- quit 2/17 12/06/2015   Status post hysterectomy 12/06/2015   Bipolar I disorder, most recent episode mixed 11/30/2015   GERD (gastroesophageal reflux disease) 11/30/2015   Psoriasiform dermatitis 11/30/2015   Mixed hyperlipidemia 11/30/2015   History of breast augmentation  11/30/2015   Chronic fatigue 11/30/2015   Hypertension, benign essential, goal below 140/90 11/30/2015   H/O abnormal cervical Papanicolaou smear 11/30/2015   Memory changes 11/30/2015   Marijuana smoker, continuous 11/30/2015   Ache in joint 11/26/2015   Gen Arthritis 11/26/2015   Family history of breast cancer in first degree relative 11/26/2015   Moderate episode of recurrent major depressive disorder 11/26/2015   Acute stress disorder 11/26/2015   Vitamin D deficiency 11/26/2015    Past Surgical History:  Procedure Laterality Date   ABDOMINAL HYSTERECTOMY     AUGMENTATION MAMMAPLASTY Bilateral    BLADDER REPAIR     BLADDER SURGERY     BREAST ENHANCEMENT SURGERY     RECTAL PROLAPSE REPAIR     REPAIR OF RECTAL PROLAPSE     TONSILLECTOMY      OB History     Gravida  5   Para  4   Term  4   Preterm  0   AB  1   Living         SAB  1   IAB  0   Ectopic  0   Multiple      Live Births               Home Medications    Prior to Admission medications   Medication Sig Start Date End Date Taking? Authorizing  Provider  nystatin (MYCOSTATIN) 100000 UNIT/ML suspension Take 5 mLs (500,000 Units total) by mouth 4 (four) times daily. Retain in mouth as long as possible before swallowing 07/04/22  Yes Geoffery Aultman, Marzella Schlein, NP  buPROPion [facility] SR) 150 MG 12 hr tablet Take 150 mg by mouth daily.  11/08/17   [provider]  chlordiazePOXIDE (LIBRIUM) 25 MG capsule  PO TID x 1D, then 25-50mg  PO BID X 1D, then 25-50mg  PO QD X 1D 08/22/21   Edwin Dada P, DO  hydrochlorothiazide (HYDRODIURIL) 25 MG tablet Take 25 mg by mouth daily.    [provider]  lamoTRIgine (LAMICTAL) 25 MG tablet Take 75 mg by mouth at bedtime.     [provider]  lisinopril (PRINIVIL,ZESTRIL) 5 MG tablet Take 10 mg by mouth daily.     [provider]  QUEtiapine (SEROQUEL) 100 MG tablet Take 100 mg by mouth at bedtime.    [provider]   sertraline (ZOLOFT) 50 MG tablet Take 50 mg by mouth daily.  11/08/17   [provider]  traMADol (ULTRAM) 50 MG tablet Take 1 tablet (50 mg total) by mouth every 6 (six) hours as needed. Patient not taking: Reported on 03/24/2020 01/28/20   Elvina Sidle, MD  VITAMIN D PO Take by mouth.    [provider]    Family History Family History  Problem Relation Age of Onset   Breast cancer Mother    Alcohol abuse Father    Depression Father    Colon cancer Maternal Grandfather    Colon polyps Neg Hx    Esophageal cancer Neg Hx    Stomach cancer Neg Hx    Rectal cancer Neg Hx     Social History Social History   Tobacco Use   Smoking status: Every Day    Packs/day: .5    Types: Cigarettes   Smokeless tobacco: Never  Vaping Use   Vaping Use: Never used  Substance Use Topics   Alcohol use: Yes    Alcohol/week: 3.0 - 4.0 standard drinks of alcohol    Types: 3 - 4 Cans of beer per week   Drug use: Yes    Types: Marijuana    Comment: Daily     Allergies   Fish allergy and Other   Review of Systems Review of Systems   Physical Exam Triage Vital Signs ED Triage Vitals [07/04/22 1552]  Enc Vitals Group     BP 115/75     Pulse Rate 74     Resp 16     Temp 98 F (36.7 C)     Temp Source Oral     SpO2 97 %     Weight      Height      Head Circumference      Peak Flow      Pain Score 0     Pain Loc      Pain Edu?      Excl. in GC?    No data found.  Updated Vital Signs BP 115/75 (BP Location: Right Arm)   Pulse 74   Temp 98 F (36.7 C) (Oral)   Resp 16   SpO2 97%   Visual Acuity Right Eye Distance:   Left Eye Distance:   Bilateral Distance:    Right Eye Near:   Left Eye Near:    Bilateral Near:     Physical Exam Constitutional:      General: She is not in acute distress.  Appearance: Normal appearance. She is not ill-appearing.  HENT:     Mouth/Throat:     Mouth: Mucous membranes are dry. No oral lesions.     Comments:  Tongue with white coat and is a little erythematous, no patches that are c/w yeast Pulmonary:     Effort: Pulmonary effort is normal.  Neurological:     Mental Status: She is alert.  Psychiatric:        Mood and Affect: Mood normal.        Behavior: Behavior normal.        Thought Content: Thought content normal.        Judgment: Judgment normal.      UC Treatments / Results  Labs (all labs ordered are listed, but only abnormal results are displayed) Labs Reviewed  TSH    EKG   Radiology No results found.  Procedures Procedures (including critical care time)  Medications Ordered in UC Medications - No data to display  Initial Impression / Assessment and Plan / UC Course  I have reviewed the triage vital signs and the nursing notes.  Pertinent labs & imaging results that were available during my care of the patient were reviewed by me and considered in my medical decision making (see chart for details).    Will check TSH. I don't think pt has thrush but will try oral nystatin. She will f/u with her mental health providers about finding a therapist, working to reduce marijuana use.   Final Clinical Impressions(s) / UC Diagnoses   Final diagnoses:  Dry mouth     Discharge Instructions      Consider talking with a therapist again as you work toward making some changes in your life.   You will get a call if tests are abnormal, you will not get a call if tests are normal but you can check results in MyChart if you have a MyChart account.     ED Prescriptions     Medication Sig Dispense Auth. Provider   nystatin (MYCOSTATIN) 100000 UNIT/ML suspension Take 5 mLs (500,000 Units total) by mouth 4 (four) times daily. Retain in mouth as long as possible before swallowing 180 mL Orra Nolde, Marzella Schlein, NP      PDMP not reviewed this encounter.   Cathlyn Parsons, NP 07/04/22 1623

## 2022-07-05 LAB — TSH: TSH: 6.05 u[IU]/mL — ABNORMAL HIGH (ref 0.450–4.500)

## 2022-07-08 DIAGNOSIS — E559 Vitamin D deficiency, unspecified: Secondary | ICD-10-CM | POA: Diagnosis not present

## 2022-07-08 DIAGNOSIS — Z0001 Encounter for general adult medical examination with abnormal findings: Secondary | ICD-10-CM | POA: Diagnosis not present

## 2022-07-08 DIAGNOSIS — R946 Abnormal results of thyroid function studies: Secondary | ICD-10-CM | POA: Diagnosis not present

## 2022-07-08 DIAGNOSIS — J449 Chronic obstructive pulmonary disease, unspecified: Secondary | ICD-10-CM | POA: Diagnosis not present

## 2022-07-08 DIAGNOSIS — E785 Hyperlipidemia, unspecified: Secondary | ICD-10-CM | POA: Diagnosis not present

## 2022-07-08 DIAGNOSIS — I10 Essential (primary) hypertension: Secondary | ICD-10-CM | POA: Diagnosis not present

## 2022-07-15 DIAGNOSIS — H25012 Cortical age-related cataract, left eye: Secondary | ICD-10-CM | POA: Diagnosis not present

## 2022-07-15 DIAGNOSIS — H04123 Dry eye syndrome of bilateral lacrimal glands: Secondary | ICD-10-CM | POA: Diagnosis not present

## 2022-07-15 DIAGNOSIS — H2513 Age-related nuclear cataract, bilateral: Secondary | ICD-10-CM | POA: Diagnosis not present

## 2022-07-15 DIAGNOSIS — H5203 Hypermetropia, bilateral: Secondary | ICD-10-CM | POA: Diagnosis not present

## 2022-07-15 DIAGNOSIS — H524 Presbyopia: Secondary | ICD-10-CM | POA: Diagnosis not present

## 2022-08-02 DIAGNOSIS — E785 Hyperlipidemia, unspecified: Secondary | ICD-10-CM | POA: Diagnosis not present

## 2022-08-02 DIAGNOSIS — F4310 Post-traumatic stress disorder, unspecified: Secondary | ICD-10-CM | POA: Diagnosis not present

## 2022-08-02 DIAGNOSIS — J449 Chronic obstructive pulmonary disease, unspecified: Secondary | ICD-10-CM | POA: Diagnosis not present

## 2022-08-02 DIAGNOSIS — I10 Essential (primary) hypertension: Secondary | ICD-10-CM | POA: Diagnosis not present

## 2022-08-05 DIAGNOSIS — F4310 Post-traumatic stress disorder, unspecified: Secondary | ICD-10-CM | POA: Diagnosis not present

## 2022-08-05 DIAGNOSIS — F319 Bipolar disorder, unspecified: Secondary | ICD-10-CM | POA: Diagnosis not present

## 2022-09-26 DIAGNOSIS — H5213 Myopia, bilateral: Secondary | ICD-10-CM | POA: Diagnosis not present

## 2022-10-03 DIAGNOSIS — R946 Abnormal results of thyroid function studies: Secondary | ICD-10-CM | POA: Diagnosis not present

## 2022-10-03 DIAGNOSIS — Z0001 Encounter for general adult medical examination with abnormal findings: Secondary | ICD-10-CM | POA: Diagnosis not present

## 2022-10-03 DIAGNOSIS — I10 Essential (primary) hypertension: Secondary | ICD-10-CM | POA: Diagnosis not present

## 2022-10-03 DIAGNOSIS — E559 Vitamin D deficiency, unspecified: Secondary | ICD-10-CM | POA: Diagnosis not present

## 2022-10-03 DIAGNOSIS — R7301 Impaired fasting glucose: Secondary | ICD-10-CM | POA: Diagnosis not present

## 2022-10-03 DIAGNOSIS — E785 Hyperlipidemia, unspecified: Secondary | ICD-10-CM | POA: Diagnosis not present

## 2022-11-07 DIAGNOSIS — H5213 Myopia, bilateral: Secondary | ICD-10-CM | POA: Diagnosis not present

## 2022-12-14 ENCOUNTER — Ambulatory Visit
Admission: EM | Admit: 2022-12-14 | Discharge: 2022-12-14 | Disposition: A | Discharge status: Home / Self Care | Payer: Medicaid Other | Attending: Internal Medicine | Admitting: Internal Medicine

## 2022-12-14 DIAGNOSIS — F430 Acute stress reaction: Secondary | ICD-10-CM | POA: Insufficient documentation

## 2022-12-14 DIAGNOSIS — S8011XA Contusion of right lower leg, initial encounter: Secondary | ICD-10-CM

## 2022-12-14 DIAGNOSIS — F331 Major depressive disorder, recurrent, moderate: Secondary | ICD-10-CM | POA: Insufficient documentation

## 2022-12-14 DIAGNOSIS — I10 Essential (primary) hypertension: Secondary | ICD-10-CM | POA: Insufficient documentation

## 2022-12-14 DIAGNOSIS — M2550 Pain in unspecified joint: Secondary | ICD-10-CM | POA: Insufficient documentation

## 2022-12-14 NOTE — ED Provider Notes (Signed)
[facility]    CSN: [id number] Arrival date & time: 12/14/22  1406      History   Chief Complaint Chief Complaint  Patient presents with   Leg Injury    HPI Rebecca Espinoza is a 62 y.o. female.   HPI Pain right lower leg abrupt onset after being hit by a rock while in her grass 1 hour ago at home.  Admits to local swelling and tenderness, admits pain.  Able to bear weight, denies laceration  Past Medical History:  Diagnosis Date   Acid reflux    Arthritis    Bipolar 1 disorder (HCC)    Depression    Hyperlipidemia    Hypertension    Post traumatic stress disorder    Psoriasis    Sleep apnea    no cpap    Patient Active Problem List   Diagnosis Date Noted   HTN (hypertension) 12/14/2022   Arthralgia 12/14/2022   Stress reaction 12/14/2022   MDD (major depressive disorder), recurrent episode, moderate (HCC) 12/14/2022   Chronic pain of left ankle 11/25/2021   Low back pain 08/21/2019   Lipoma of shoulder 10/16/2017   Shoulder pain 08/28/2017   Calcific tendinitis of shoulder 08/28/2017   Psoriasis with arthropathy (HCC) 08/15/2017   Calcific tendinitis of left shoulder 08/08/2017   Hypokalemia- mild 01/03/2016   Subclinical hypothyroidism 01/03/2016   History of tobacco use disorder- quit 2/17 12/06/2015   Status post hysterectomy 12/06/2015   Bipolar I disorder, most recent episode mixed (HCC) 11/30/2015   GERD (gastroesophageal reflux disease) 11/30/2015   Psoriasiform dermatitis 11/30/2015   Mixed hyperlipidemia 11/30/2015   History of breast augmentation 11/30/2015   Chronic fatigue 11/30/2015   Hypertension, benign essential, goal below 140/90 11/30/2015   H/O abnormal cervical Papanicolaou smear 11/30/2015   Memory changes 11/30/2015   Marijuana smoker, continuous 11/30/2015   Ache in joint 11/26/2015   Gen Arthritis 11/26/2015   Family history of breast cancer in first degree relative 11/26/2015   Moderate episode of recurrent major  depressive disorder (HCC) 11/26/2015   Acute stress disorder 11/26/2015   Vitamin D deficiency 11/26/2015    Past Surgical History:  Procedure Laterality Date   ABDOMINAL HYSTERECTOMY     AUGMENTATION MAMMAPLASTY Bilateral    BLADDER REPAIR     BLADDER SURGERY     BREAST ENHANCEMENT SURGERY     RECTAL PROLAPSE REPAIR     REPAIR OF RECTAL PROLAPSE     TONSILLECTOMY      OB History     Gravida  5   Para  4   Term  4   Preterm  0   AB  1   Living         SAB  1   IAB  0   Ectopic  0   Multiple      Live Births               Home Medications    Prior to Admission medications   Medication Sig Start Date End Date Taking? Authorizing Provider  atorvastatin (LIPITOR) 20 MG tablet Take 20 mg by mouth daily. 10/04/22  Yes [provider]  buPROPion (WELLBUTRIN SR) 150 MG 12 hr tablet Take 150 mg by mouth daily.  11/08/17   [provider]  buPROPion (ZYBAN) 150 MG 12 hr tablet Take 150 mg by mouth 2 (two) times daily.    [provider]  chlordiazePOXIDE (LIBRIUM) 25 MG capsule 50mg  PO TID x  1D, then 25-50mg  PO BID X 1D, then 25-50mg  PO QD X 1D 08/22/21   Edwin Dada P, DO  hydrochlorothiazide (HYDRODIURIL) 25 MG tablet Take 25 mg by mouth daily.    [provider]  lamoTRIgine (LAMICTAL) 100 MG tablet Take 100 mg by mouth daily.    [provider]  lamoTRIgine (LAMICTAL) 25 MG tablet Take 75 mg by mouth at bedtime.     [provider]  lisinopril (PRINIVIL,ZESTRIL) 5 MG tablet Take 10 mg by mouth daily.     [provider]  lisinopril (ZESTRIL) 10 MG tablet Take 10 mg by mouth daily.    [provider]  nystatin (MYCOSTATIN) 100000 UNIT/ML suspension Take 5 mLs (500,000 Units total) by mouth 4 (four) times daily. Retain in mouth as long as possible before swallowing 07/04/22   Cathlyn Parsons, NP  predniSONE (DELTASONE) 10 MG tablet Take 10 mg by mouth as directed.    [provider]   QUEtiapine (SEROQUEL) 100 MG tablet Take 100 mg by mouth at bedtime.    [provider]  sertraline (ZOLOFT) 100 MG tablet Take 100 mg by mouth daily.    [provider]  sertraline (ZOLOFT) 50 MG tablet Take 50 mg by mouth daily.  11/08/17   [provider]  traMADol (ULTRAM) 50 MG tablet Take 1 tablet (50 mg total) by mouth every 6 (six) hours as needed. Patient not taking: Reported on 03/24/2020 01/28/20   Elvina Sidle, MD  VITAMIN D PO Take by mouth.    [provider]    Family History Family History  Problem Relation Age of Onset   Breast cancer Mother    Alcohol abuse Father    Depression Father    Colon cancer Maternal Grandfather    Colon polyps Neg Hx    Esophageal cancer Neg Hx    Stomach cancer Neg Hx    Rectal cancer Neg Hx     Social History Social History   Tobacco Use   Smoking status: Every Day    Current packs/day: 0.50    Types: Cigarettes   Smokeless tobacco: Never  Vaping Use   Vaping status: Never Used  Substance Use Topics   Alcohol use: Yes    Alcohol/week: 3.0 - 4.0 standard drinks of alcohol    Types: 3 - 4 Cans of beer per week   Drug use: Yes    Types: Marijuana    Comment: Daily     Allergies   Fish allergy and Other   Review of Systems Review of Systems  Skin:  Negative for color change and wound.     Physical Exam Triage Vital Signs ED Triage Vitals  Encounter Vitals Group     BP 12/14/22 1413 127/74     Systolic BP Percentile --      Diastolic BP Percentile --      Pulse Rate 12/14/22 1413 84     Resp 12/14/22 1413 16     Temp 12/14/22 1413 98.4 F (36.9 C)     Temp Source 12/14/22 1413 Oral     SpO2 12/14/22 1413 97 %     Weight --      Height --      Head Circumference --      Peak Flow --      Pain Score 12/14/22 1415 5     Pain Loc --      Pain Education --      Exclude from [employer]  Chart --    No data found.  Updated Vital Signs BP 127/74 (BP Location: Left Arm)    Pulse 84   Temp 98.4 F (36.9 C) (Oral)   Resp 16   SpO2 97%   Visual Acuity Right Eye Distance:   Left Eye Distance:   Bilateral Distance:    Right Eye Near:   Left Eye Near:    Bilateral Near:     Physical Exam Vitals and nursing note reviewed.  HENT:     Head: Normocephalic.  Pulmonary:     Effort: Pulmonary effort is normal. No respiratory distress.  Musculoskeletal:       Legs:     Comments: Mild local swelling and tenderness right proximal lower leg, no ecchymosis, no lacerations, no deformity  Skin:    General: Skin is warm and dry.     Findings: Rash (Rash on lower extremities consistent with psoriasis) present.  Neurological:     Mental Status: She is alert.      UC Treatments / Results  Labs (all labs ordered are listed, but only abnormal results are displayed) Labs Reviewed - No data to display  EKG   Radiology No results found.  Procedures Procedures (including critical care time)  Medications Ordered in UC Medications - No data to display  Initial Impression / Assessment and Plan / UC Course  I have reviewed the triage vital signs and the nursing notes.  Pertinent labs & imaging results that were available during my care of the patient were reviewed by me and considered in my medical decision making (see chart for details).     62 year old female with pain and local swelling after being hit by a rock while mowing grass, mild local swelling and tenderness no deformity able to bear weight. Management of contusion reviewed with patient Recommend RICE therapy, Ace wrap applied Final Clinical Impressions(s) / UC Diagnoses   Final diagnoses:  Contusion of right lower leg, initial encounter     Discharge Instructions      Rest, ice application, elevation, Ace wrap to reduce swelling.  Over-the-counter Tylenol as directed on the package.  Follow-up if your symptoms fail to improve or you develop worsening pain or concerns   ED  Prescriptions   None    PDMP not reviewed this encounter.   Meliton Rattan, [place] 12/14/22 1425

## 2022-12-14 NOTE — Discharge Instructions (Signed)
Rest, ice application, elevation, Ace wrap to reduce swelling.  Over-the-counter Tylenol as directed on the package.  Follow-up if your symptoms fail to improve or you develop worsening pain or concerns

## 2022-12-14 NOTE — ED Triage Notes (Signed)
Pt states she was mowing the lawn and a rock hit her right shin.  Bruising noted to right shin. Pt ambulates with a steady gait.

## 2023-02-27 DIAGNOSIS — F4310 Post-traumatic stress disorder, unspecified: Secondary | ICD-10-CM | POA: Diagnosis not present

## 2023-02-27 DIAGNOSIS — F319 Bipolar disorder, unspecified: Secondary | ICD-10-CM | POA: Diagnosis not present

## 2023-04-18 DIAGNOSIS — Z1231 Encounter for screening mammogram for malignant neoplasm of breast: Secondary | ICD-10-CM | POA: Diagnosis not present

## 2023-04-25 ENCOUNTER — Encounter: Payer: Self-pay | Admitting: Gastroenterology

## 2023-05-09 DIAGNOSIS — R5383 Other fatigue: Secondary | ICD-10-CM | POA: Diagnosis not present

## 2023-05-09 DIAGNOSIS — E785 Hyperlipidemia, unspecified: Secondary | ICD-10-CM | POA: Diagnosis not present

## 2023-05-09 DIAGNOSIS — K5909 Other constipation: Secondary | ICD-10-CM | POA: Diagnosis not present

## 2023-05-09 DIAGNOSIS — Z0001 Encounter for general adult medical examination with abnormal findings: Secondary | ICD-10-CM | POA: Diagnosis not present

## 2023-05-09 DIAGNOSIS — F319 Bipolar disorder, unspecified: Secondary | ICD-10-CM | POA: Diagnosis not present

## 2023-05-09 DIAGNOSIS — J449 Chronic obstructive pulmonary disease, unspecified: Secondary | ICD-10-CM | POA: Diagnosis not present

## 2023-05-09 DIAGNOSIS — I10 Essential (primary) hypertension: Secondary | ICD-10-CM | POA: Diagnosis not present

## 2023-05-09 DIAGNOSIS — E559 Vitamin D deficiency, unspecified: Secondary | ICD-10-CM | POA: Diagnosis not present

## 2023-05-09 DIAGNOSIS — G3184 Mild cognitive impairment, so stated: Secondary | ICD-10-CM | POA: Diagnosis not present

## 2023-05-09 DIAGNOSIS — R946 Abnormal results of thyroid function studies: Secondary | ICD-10-CM | POA: Diagnosis not present

## 2023-05-09 DIAGNOSIS — R319 Hematuria, unspecified: Secondary | ICD-10-CM | POA: Diagnosis not present

## 2023-05-09 DIAGNOSIS — R1012 Left upper quadrant pain: Secondary | ICD-10-CM | POA: Diagnosis not present

## 2023-05-09 DIAGNOSIS — R7301 Impaired fasting glucose: Secondary | ICD-10-CM | POA: Diagnosis not present

## 2023-05-10 ENCOUNTER — Encounter: Payer: Self-pay | Admitting: Gastroenterology

## 2023-05-11 DIAGNOSIS — R319 Hematuria, unspecified: Secondary | ICD-10-CM | POA: Diagnosis not present

## 2023-05-23 DIAGNOSIS — E785 Hyperlipidemia, unspecified: Secondary | ICD-10-CM | POA: Diagnosis not present

## 2023-05-23 DIAGNOSIS — S060X0S Concussion without loss of consciousness, sequela: Secondary | ICD-10-CM | POA: Diagnosis not present

## 2023-05-23 DIAGNOSIS — J449 Chronic obstructive pulmonary disease, unspecified: Secondary | ICD-10-CM | POA: Diagnosis not present

## 2023-05-23 DIAGNOSIS — R4189 Other symptoms and signs involving cognitive functions and awareness: Secondary | ICD-10-CM | POA: Diagnosis not present

## 2023-05-23 DIAGNOSIS — G3184 Mild cognitive impairment, so stated: Secondary | ICD-10-CM | POA: Diagnosis not present

## 2023-05-23 DIAGNOSIS — I10 Essential (primary) hypertension: Secondary | ICD-10-CM | POA: Diagnosis not present

## 2023-06-02 ENCOUNTER — Ambulatory Visit: Payer: Medicaid Other

## 2023-06-02 VITALS — Ht 65.0 in | Wt 140.0 lb

## 2023-06-02 DIAGNOSIS — Z860100 Personal history of colon polyps, unspecified: Secondary | ICD-10-CM

## 2023-06-02 MED ORDER — SUFLAVE 178.7 G PO SOLR: 1.0000 | Freq: Once | ORAL | 0 refills | Status: AC

## 2023-06-02 NOTE — Progress Notes (Signed)
 No egg or soy allergy known to patient  No issues known to pt with past sedation with any surgeries or procedures Patient denies ever being told they had issues or difficulty with intubation  No FH of Malignant Hyperthermia Pt is not on diet pills Pt is not on  home 02  Pt is not on blood thinners  Pt has issues with constipation (Xtra Miralax on prep ) No A fib or A flutter Have any cardiac testing pending--No Pt can ambulate  Pt denies use of chewing tobacco Discussed diabetic I weight loss medication holds Discussed NSAID holds Checked BMI Pt instructed to use Single[employer] or GoodRx for a price reduction on prep  Patient's chart reviewed by Cathlyn Parsons CNRA prior to previsit and patient appropriate for the [facility].  Pre visit completed and red dot placed by patient's name on their procedure day (on provider's schedule).

## 2023-06-13 ENCOUNTER — Telehealth: Payer: Self-pay | Admitting: Gastroenterology

## 2023-06-13 MED ORDER — NA SULFATE-K SULFATE-MG SULF 17.5-3.13-1.6 GM/177ML PO SOLN: 1.0000 | Freq: Once | ORAL | 0 refills | Status: AC

## 2023-06-13 NOTE — Telephone Encounter (Signed)
 RN returned call to patient regarding [facility] not having her prep medication. RN offered to send Suprep to local pharmacy due to time constraints; patient agreed. RN sent prescription to [facility] on [address] in [place].  Patient stated understanding and was appreciative.

## 2023-06-13 NOTE — Telephone Encounter (Signed)
 Inbound call from stating she spoke with [facility] but they do not have a prescription for her prep. Patient is requesting a call back. Please advise, thank you.

## 2023-06-18 ENCOUNTER — Encounter: Payer: Self-pay | Admitting: Certified Registered Nurse Anesthetist

## 2023-06-22 ENCOUNTER — Ambulatory Visit (AMBULATORY_SURGERY_CENTER): Payer: Medicaid Other | Admitting: Gastroenterology

## 2023-06-22 ENCOUNTER — Encounter: Payer: Self-pay | Admitting: Gastroenterology

## 2023-06-22 VITALS — BP 125/84 | HR 59 | Temp 97.9°F | Resp 14 | Ht 65.0 in | Wt 140.0 lb

## 2023-06-22 DIAGNOSIS — Z1211 Encounter for screening for malignant neoplasm of colon: Secondary | ICD-10-CM

## 2023-06-22 DIAGNOSIS — K5730 Diverticulosis of large intestine without perforation or abscess without bleeding: Secondary | ICD-10-CM | POA: Diagnosis not present

## 2023-06-22 DIAGNOSIS — F319 Bipolar disorder, unspecified: Secondary | ICD-10-CM | POA: Diagnosis not present

## 2023-06-22 DIAGNOSIS — K648 Other hemorrhoids: Secondary | ICD-10-CM

## 2023-06-22 DIAGNOSIS — D120 Benign neoplasm of cecum: Secondary | ICD-10-CM

## 2023-06-22 DIAGNOSIS — G4730 Sleep apnea, unspecified: Secondary | ICD-10-CM | POA: Diagnosis not present

## 2023-06-22 DIAGNOSIS — Z860101 Personal history of adenomatous and serrated colon polyps: Secondary | ICD-10-CM

## 2023-06-22 DIAGNOSIS — Z860100 Personal history of colon polyps, unspecified: Secondary | ICD-10-CM

## 2023-06-22 MED ORDER — SODIUM CHLORIDE 0.9 % IV SOLN: 500.0000 mL | Freq: Once | INTRAVENOUS | Status: DC

## 2023-06-22 NOTE — Progress Notes (Signed)
 Report given to PACU, vss

## 2023-06-22 NOTE — Progress Notes (Signed)
 Called to room to assist during endoscopic procedure.  Patient ID and intended procedure confirmed with present staff. Received instructions for my participation in the procedure from the performing physician.

## 2023-06-22 NOTE — Op Note (Addendum)
 [facility] Patient Name: Rebecca Espinoza Procedure Date: 06/22/2023 2:14 PM MRN: [id number] Endoscopist: Viviann Spare P. Adela Lank , MD, [id number] Age: 63 Referring MD:  Date of Birth: 1961-02-28 Gender: Female Account #: [id number] Procedure:                Colonoscopy Indications:              High risk colon cancer surveillance: Personal                            history of colonic polyps - 4 polyps removed 03/2020 Medicines:                Monitored Anesthesia Care Procedure:                Pre-Anesthesia Assessment:                           - Prior to the procedure, a History and Physical                            was performed, and patient medications and                            allergies were reviewed. The patient's tolerance of                            previous anesthesia was also reviewed. The risks                            and benefits of the procedure and the sedation                            options and risks were discussed with the patient.                            All questions were answered, and informed consent                            was obtained. Prior Anticoagulants: The patient has                            taken no anticoagulant or antiplatelet agents. ASA                            Grade Assessment: II - A patient with mild systemic                            disease. After reviewing the risks and benefits,                            the patient was deemed in satisfactory condition to                            undergo the procedure.  After obtaining informed consent, the colonoscope                            was passed under direct vision. Throughout the                            procedure, the patient's blood pressure, pulse, and                            oxygen saturations were monitored continuously. The                            [id number]-HQ190L Colonoscope [id number] was introduced                            through  the anus and advanced to the the cecum,                            identified by appendiceal orifice and ileocecal                            valve. The colonoscopy was performed without                            difficulty. The patient tolerated the procedure                            well. The quality of the bowel preparation was                            adequate. The ileocecal valve, appendiceal orifice,                            and rectum were photographed. Scope In: 2:21:22 PM Scope Out: 2:37:22 PM Scope Withdrawal Time: 0 hours 11 minutes 44 seconds  Total Procedure Duration: 0 hours 16 minutes 0 seconds  Findings:                 The perianal and digital rectal examinations were                            normal.                           Two flat and sessile polyps were found in the                            cecum. The polyps were 3 mm in size. These polyps                            were removed with a cold snare. Resection and                            retrieval were complete.  A few small-mouthed diverticula were found in the                            sigmoid colon.                           Internal hemorrhoids were found. The hemorrhoids                            were small.                           The sigmoid colon was significantly restricted,                            pediatric colonoscope used to complete the exam.                            Due to small size of the rectum, retroflexed views                            not able to be obtained. The exam was otherwise                            without abnormality. Complications:            No immediate complications. Estimated blood loss:                            Minimal. Estimated Blood Loss:     Estimated blood loss was minimal. Impression:               - Two 3 mm polyps in the cecum, removed with a cold                            snare. Resected and retrieved.                            - Diverticulosis in the sigmoid colon.                           - Restricted mobility of the sigmoid colon.                           - Internal hemorrhoids.                           - The examination was otherwise normal. Recommendation:           - Patient has a contact number available for                            emergencies. The signs and symptoms of potential                            delayed complications were discussed with the  patient. Return to normal activities tomorrow.                            Written discharge instructions were provided to the                            patient.                           - Resume previous diet.                           - Continue present medications.                           - Await pathology results. Viviann Spare P. Johnasia Liese, MD 06/22/2023 2:45:31 PM This report has been signed electronically.

## 2023-06-22 NOTE — Patient Instructions (Addendum)
 Resume previous diet and medications.  Handouts provided on polyps, hemorrhoids, and diverticulosis.  Follow up colonoscopy based on biopsy results.    YOU HAD AN ENDOSCOPIC PROCEDURE TODAY AT THE [facility]:   Refer to the procedure report that was given to you for any specific questions about what was found during the examination.  If the procedure report does not answer your questions, please call your gastroenterologist to clarify.  If you requested that your care partner not be given the details of your procedure findings, then the procedure report has been included in a sealed envelope for you to review at your convenience later.  YOU SHOULD EXPECT: Some feelings of bloating in the abdomen. Passage of more gas than usual.  Walking can help get rid of the air that was put into your GI tract during the procedure and reduce the bloating. If you had a lower endoscopy (such as a colonoscopy or flexible sigmoidoscopy) you may notice spotting of blood in your stool or on the toilet paper. If you underwent a bowel prep for your procedure, you may not have a normal bowel movement for a few days.  Please Note:  You might notice some irritation and congestion in your nose or some drainage.  This is from the oxygen used during your procedure.  There is no need for concern and it should clear up in a day or so.  SYMPTOMS TO REPORT IMMEDIATELY:  Following lower endoscopy (colonoscopy or flexible sigmoidoscopy):  Excessive amounts of blood in the stool  Significant tenderness or worsening of abdominal pains  Swelling of the abdomen that is new, acute  Fever of 100F or higher  For urgent or emergent issues, a gastroenterologist can be reached at any hour by calling [phone]. Do not use MyChart messaging for urgent concerns.    DIET:  We do recommend a small meal at first, but then you may proceed to your regular diet.  Drink plenty of fluids but you should avoid alcoholic beverages for  24 hours.  ACTIVITY:  You should plan to take it easy for the rest of today and you should NOT DRIVE or use heavy machinery until tomorrow (because of the sedation medicines used during the test).    FOLLOW UP: Our staff will call the number listed on your records the next business day following your procedure.  We will call around 7:15- 8:00 am to check on you and address any questions or concerns that you may have regarding the information given to you following your procedure. If we do not reach you, we will leave a message.     If any biopsies were taken you will be contacted by phone or by letter within the next 1-3 weeks.  Please call us at [phone] if you have not heard about the biopsies in 3 weeks.    SIGNATURES/CONFIDENTIALITY: You and/or your care partner have signed paperwork which will be entered into your electronic medical record.  These signatures attest to the fact that that the information above on your After Visit Summary has been reviewed and is understood.  Full responsibility of the confidentiality of this discharge information lies with you and/or your care-partner.

## 2023-06-22 NOTE — Progress Notes (Signed)
 Pt's states no medical or surgical changes since previsit or office visit.

## 2023-06-22 NOTE — Progress Notes (Signed)
 [facility] History and Physical   Primary Care Physician:  Dois Davenport, MD   Reason for Procedure:   History of colon polyps  Plan:    colonoscopy     HPI: Rebecca Espinoza is a 63 y.o. female  here for colonoscopy surveillance - last exam 03/2020 - 4 polyps - adenomas / sessile serrated.   Patient denies any bowel symptoms at this time. No family history of colon cancer known in first degree relatives (grandfather had CRC). Otherwise feels well without any cardiopulmonary symptoms.   I have discussed risks / benefits of anesthesia and endoscopic procedure with Richardson Dopp and they wish to proceed with the exams as outlined today.    Past Medical History:  Diagnosis Date   Acid reflux    Arthritis    Bipolar 1 disorder (HCC)    Depression    Hyperlipidemia    Hypertension    Post traumatic stress disorder    Psoriasis    Sleep apnea    no cpap   Substance abuse (HCC)     Past Surgical History:  Procedure Laterality Date   ABDOMINAL HYSTERECTOMY     AUGMENTATION MAMMAPLASTY Bilateral    BLADDER REPAIR     BLADDER SURGERY     BREAST ENHANCEMENT SURGERY     RECTAL PROLAPSE REPAIR     REPAIR OF RECTAL PROLAPSE     TONSILLECTOMY      Prior to Admission medications   Medication Sig Start Date End Date Taking? Authorizing Provider  atorvastatin (LIPITOR) 20 MG tablet Take 20 mg by mouth daily. 10/04/22  Yes [provider]  buPROPion (WELLBUTRIN SR) 150 MG 12 hr tablet Take 150 mg by mouth daily.  11/08/17  Yes [provider]  hydrochlorothiazide (HYDRODIURIL) 25 MG tablet Take 25 mg by mouth daily.   Yes [provider]  lamoTRIgine (LAMICTAL) 100 MG tablet Take 100 mg by mouth daily.   Yes [provider]  lisinopril (ZESTRIL) 10 MG tablet Take 10 mg by mouth daily.   Yes [provider]  QUEtiapine (SEROQUEL) 100 MG tablet Take 100 mg by mouth at bedtime.   Yes [provider]  sertraline (ZOLOFT) 100  MG tablet Take 100 mg by mouth daily.   Yes [provider]  LINZESS 72 MCG capsule Take 72 mcg by mouth every morning. Patient not taking: Reported on 06/22/2023 05/11/23   [provider]  omeprazole (PRILOSEC) 20 MG capsule Take 20 mg by mouth daily.    [provider]    Current Outpatient Medications  Medication Sig Dispense Refill   atorvastatin (LIPITOR) 20 MG tablet Take 20 mg by mouth daily.     buPROPion (WELLBUTRIN SR) 150 MG 12 hr tablet Take 150 mg by mouth daily.   1   hydrochlorothiazide (HYDRODIURIL) 25 MG tablet Take 25 mg by mouth daily.     lamoTRIgine (LAMICTAL) 100 MG tablet Take 100 mg by mouth daily.     lisinopril (ZESTRIL) 10 MG tablet Take 10 mg by mouth daily.     QUEtiapine (SEROQUEL) 100 MG tablet Take 100 mg by mouth at bedtime.     sertraline (ZOLOFT) 100 MG tablet Take 100 mg by mouth daily.     LINZESS 72 MCG capsule Take 72 mcg by mouth every morning. (Patient not taking: Reported on 06/22/2023)     omeprazole (PRILOSEC) 20 MG capsule Take 20 mg by mouth daily.     Current Facility-Administered Medications  Medication Dose  Route Frequency Provider Last Rate Last Admin   0.9 %  sodium chloride infusion  500 mL Intravenous Once Duane Trias, Willaim Rayas, MD        Allergies as of 06/22/2023 - Review Complete 06/22/2023  Allergen Reaction Noted   Fish allergy Nausea And Vomiting 06/30/2015    Family History  Problem Relation Age of Onset   Breast cancer Mother    Alcohol abuse Father    Depression Father    Colon cancer Maternal Grandfather    Colon cancer Cousin    Colon polyps Neg Hx    Esophageal cancer Neg Hx    Stomach cancer Neg Hx    Rectal cancer Neg Hx     Social History   Socioeconomic History   Marital status: Married    Spouse name: Not on file   Number of children: Not on file   Years of education: Not on file   Highest education level: Not on file  Occupational History   Not on file  Tobacco Use    Smoking status: Former    Current packs/day: 0.50    Types: Cigarettes   Smokeless tobacco: Never  Vaping Use   Vaping status: Former  Substance and Sexual Activity   Alcohol use: Not Currently    Alcohol/week: 3.0 - 4.0 standard drinks of alcohol    Types: 3 - 4 Cans of beer per week   Drug use: Yes    Types: Marijuana    Comment: Daily, last use 06/20/23   Sexual activity: Yes    Birth control/protection: None, Surgical  Other Topics Concern   Not on file  Social History Narrative   ** Merged History Encounter **       Social Drivers of Corporate investment banker Strain: Not on file  Food Insecurity: Not on file  Transportation Needs: Not on file  Physical Activity: Not on file  Stress: Not on file  Social Connections: Not on file  Intimate Partner Violence: Not on file    Review of Systems: All other review of systems negative except as mentioned in the HPI.  Physical Exam: Vital signs BP 123/74   Pulse 71   Temp 97.9 F (36.6 C) (Temporal)   Ht 5\' 5"  (1.651 m)   Wt 140 lb (63.5 kg)   SpO2 98%   BMI 23.30 kg/m   General:   Alert,  Well-developed, pleasant and cooperative in NAD Lungs:  Clear throughout to auscultation.   Heart:  Regular rate and rhythm Abdomen:  Soft, nontender and nondistended.   Neuro/Psych:  Alert and cooperative. Normal mood and affect. A and O x 3  Harlin Rain, MD [facility]

## 2023-06-23 ENCOUNTER — Telehealth: Payer: Self-pay

## 2023-06-23 NOTE — Telephone Encounter (Signed)
 No answer after follow up call. Voice message left.

## 2023-06-27 LAB — SURGICAL PATHOLOGY

## 2023-06-28 ENCOUNTER — Ambulatory Visit: Attending: Family Medicine | Admitting: Speech Pathology

## 2023-06-28 ENCOUNTER — Encounter: Payer: Self-pay | Admitting: Speech Pathology

## 2023-06-28 ENCOUNTER — Other Ambulatory Visit: Payer: Self-pay

## 2023-06-28 DIAGNOSIS — R41841 Cognitive communication deficit: Secondary | ICD-10-CM | POA: Diagnosis present

## 2023-06-28 NOTE — Therapy (Signed)
 OUTPATIENT SPEECH LANGUAGE PATHOLOGY EVALUATION   Patient Name: Rebecca Espinoza MRN: [id number] DOB:1961-01-24, 63 y.o., female Today's Date: 06/28/2023  PCP: Rebecca Davenport, MD REFERRING PROVIDER: Dois Davenport, MD  END OF SESSION:  End of Session - 06/28/23 1516     Visit Number 1    Number of Visits 17    Date for SLP Re-Evaluation 08/23/23    Authorization Type medicaid - no auth, I am recommending PT as well and she would like to save some visits for end of year    Authorization - Number of Visits 27    SLP Start Time 1315    SLP Stop Time  1400    SLP Time Calculation (min) 45 min    Activity Tolerance Patient tolerated treatment well             Past Medical History:  Diagnosis Date   Acid reflux    Arthritis    Bipolar 1 disorder (HCC)    Depression    Hyperlipidemia    Hypertension    Post traumatic stress disorder    Psoriasis    Sleep apnea    no cpap   Substance abuse (HCC)    Past Surgical History:  Procedure Laterality Date   ABDOMINAL HYSTERECTOMY     AUGMENTATION MAMMAPLASTY Bilateral    BLADDER REPAIR     BLADDER SURGERY     BREAST ENHANCEMENT SURGERY     RECTAL PROLAPSE REPAIR     REPAIR OF RECTAL PROLAPSE     TONSILLECTOMY     Patient Active Problem List   Diagnosis Date Noted   HTN (hypertension) 12/14/2022   Arthralgia 12/14/2022   Stress reaction 12/14/2022   MDD (major depressive disorder), recurrent episode, moderate (HCC) 12/14/2022   Chronic pain of left ankle 11/25/2021   Low back pain 08/21/2019   Lipoma of shoulder 10/16/2017   Shoulder pain 08/28/2017   Calcific tendinitis of shoulder 08/28/2017   Psoriasis with arthropathy (HCC) 08/15/2017   Calcific tendinitis of left shoulder 08/08/2017   Hypokalemia- mild 01/03/2016   Subclinical hypothyroidism 01/03/2016   History of tobacco use disorder- quit 2/17 12/06/2015   Status post hysterectomy 12/06/2015   Bipolar I disorder, most recent episode mixed (HCC) 11/30/2015    GERD (gastroesophageal reflux disease) 11/30/2015   Psoriasiform dermatitis 11/30/2015   Mixed hyperlipidemia 11/30/2015   History of breast augmentation 11/30/2015   Chronic fatigue 11/30/2015   Hypertension, benign essential, goal below 140/90 11/30/2015   H/O abnormal cervical Papanicolaou smear 11/30/2015   Memory changes 11/30/2015   Marijuana smoker, continuous 11/30/2015   Ache in joint 11/26/2015   Gen Arthritis 11/26/2015   Family history of breast cancer in first degree relative 11/26/2015   Moderate episode of recurrent major depressive disorder (HCC) 11/26/2015   Acute stress disorder 11/26/2015   Vitamin D deficiency 11/26/2015    ONSET DATE: 05/30/2023   REFERRING DIAG: postconcussive syndrome  THERAPY DIAG:  Cognitive communication deficit  Rationale for Evaluation and Treatment: Rehabilitation  SUBJECTIVE:   SUBJECTIVE STATEMENT: "I forget when I showered last" Pt accompanied by: significant other "Rebecca Espinoza"  PERTINENT HISTORY: Rebecca Espinoza sustained concussion June 2021 with laceration. She reports attention and memory impairment since this time. She also reports difficulty with balance since this concussion. She has not received ST or PT prior to today for concussion  PAIN:  Are you having pain? No  FALLS: Has patient fallen in last 6 months?  Comment: near misses - endorses balance difficulties.  Instructed them to call PCP and request PT evaluation - I will request this as well.   LIVING ENVIRONMENT: Lives with: lives with their spouse Lives in: House/apartment  PLOF:  Level of assistance: Independent with ADLs Employment: Retired  PATIENT GOALS: "To improve my memory"  OBJECTIVE:  Note: Objective measures were completed at Evaluation unless otherwise noted.  DIAGNOSTIC FINDINGS:   COGNITION: Overall cognitive status: Impaired Areas of impairment:  Attention: Impaired: Alternating, Divided Memory: Impaired: Working Research scientist (medical) function: Impaired: Self-correction and Slow processing Functional deficits:   COGNITIVE COMMUNICATION: Following directions: Follows one step commands consistently  Auditory comprehension: WFL Verbal expression: Impaired: Rebecca Espinoza reports word finding difficulty since concussion Functional communication: WFL  ORAL MOTOR EXAMINATION: Overall status: WFL Comments:   STANDARDIZED ASSESSMENTS:  Initiated CLQT - to be completed next session  PATIENT REPORTED OUTCOME MEASURES (PROM): Cognitive Function: She scored a 62/140 - Rebecca Espinoza rated a "1" or cannot do or very often/several times a day - recalling errands, words on the tip of her tongue, having to read something several times to understand it, remembering whether she did something she was supposed to do (take meds, feed animals), recalling new information, walking into a room and forgetting why, thinking clearly, working hard to pay attention or she will make a mistake, trouble concentrating                                                                                                                            TREATMENT DATE:   Next session complete CLQT  06/28/23: Eval only    PATIENT EDUCATION: Education details: Areas of impairment, compensatory strategies for cognitive impairments Person educated: Patient and Spouse Education method: Explanation, Verbal cues, and Handouts Education comprehension: verbal cues required and needs further education   GOALS: Goals reviewed with patient? Yes  SHORT TERM GOALS: Target date: 07/26/23   Pt will use external aids to recall shower, feeding animals and medications with occasional min A from sposue Baseline: not recalling these tasks consistently Goal status: INITIAL  2.  Pt will carry over 2 compensatory strategies for attention and safety in the kitchen Baseline: not using strategies Goal status: INITIAL  3.  Pt will use compensatory strategies to  report misplacing items (keys, ID, phone etc) 2 or less times a week Baseline: losing items 3-4x a week Goal status: INITIAL  4.  Pt and spouse will carryover 2 strategies to support attention and recall in verbally presented information Baseline: forgetting verbal information Goal status: INITIAL  LONG TERM GOALS: Target date: 08/23/23  Pt will report no missed meds, animal feeds and recall shower and other ADL's  Baseline: forgetting thses Goal status: INITIAL  2.  Pt will carryover 3 compensatory strategies for attention and memory when working outside or with tools for safety with rare min A Baseline: no strategies Goal status: INITIAL  3.  Pt will carryover 3 strategies for slow processing, and to recall verbal information  with rare min A Baseline:  Goal status: INITIAL  4.  Pt will improve score on PROM - Cognitive function Baseline: 62 Goal status: INITIAL    ASSESSMENT:  CLINICAL IMPRESSION: Patient is a 63  y.o. female who was seen today for cognitive impairments from concussion June 2021. She reports impaired memory and attention since concussion. Rielly reports she forgets if she has showered, she has left the shower without rinsing conditioner from her hair, she has left the water on in the bath and flooded the bathroom. She also reports significant difficulty reading and following the instructions for her colonoscopy resulting in her calling poison control because she mixed the prep incorrectly. She leaves the stove on and forgets if she has fed her chickens and cat. She has lost her ID, the fob for the car, and reports frequently misplacing items.  Her spouse and she report she has frequent accidents, including drilling her hand when working. She states she also gets confused driving, missing turns. I recommend skilled ST to maximize cognition for safety, independence and to reduce caregiver burden. I also recommend PT evaluation for balance. She has neuro consult in June,  recommend neuropsych eval as well.    OBJECTIVE IMPAIRMENTS: include attention, memory, awareness, executive functioning, and expressive language. These impairments are limiting patient from managing medications, managing appointments, managing finances, household responsibilities, and ADLs/IADLs. Factors affecting potential to achieve goals and functional outcome are severity of impairments.. Patient will benefit from skilled SLP services to address above impairments and improve overall function.  REHAB POTENTIAL: Fair length of time post onset  PLAN:  SLP FREQUENCY: 2x/week  SLP DURATION: 8 weeks  PLANNED INTERVENTIONS: Language facilitation, Cueing hierachy, Cognitive reorganization, Internal/external aids, Functional tasks, Multimodal communication approach, SLP instruction and feedback, Compensatory strategies, Patient/family education, and [address] Treatment of speech (30 or 45 min)     Kaylan Friedmann, Dareen Ebbing, CCC-SLP 06/28/2023, 3:27 PM

## 2023-06-28 NOTE — Patient Instructions (Signed)
   Medicaid covers 27 therapy visits (PT, OT, Speech combined) a year  You will want to save 6 or so just in case you need some therapy later on  Dr. Lavanda Porter is the concussion doctor  A neurospychologist does in depth testing  of your cognition  Physical therapy to help with your balance

## 2023-06-30 ENCOUNTER — Encounter: Payer: Self-pay | Admitting: Gastroenterology

## 2023-07-03 ENCOUNTER — Ambulatory Visit: Admitting: Speech Pathology

## 2023-07-03 ENCOUNTER — Encounter: Payer: Self-pay | Admitting: Speech Pathology

## 2023-07-03 DIAGNOSIS — R41841 Cognitive communication deficit: Secondary | ICD-10-CM | POA: Diagnosis not present

## 2023-07-03 NOTE — Patient Instructions (Signed)
   Clutter is hard on your brain after a concussion - try to limit clutter  Use timers in the kitchen!! If you are cooking multiple things, use an egg timer, cell phone timer or microwave timer as well as the oven timer  Don't leave the kitchen if you have food on the stove

## 2023-07-03 NOTE — Therapy (Signed)
 OUTPATIENT SPEECH LANGUAGE PATHOLOGY TREATMENT   Patient Name: Rebecca Espinoza MRN: [id number] DOB:June 16, 1960, 63 y.o., female Today's Date: 07/03/2023  PCP: Allene Ivan, MD REFERRING PROVIDER: Allene Ivan, MD  END OF SESSION:  End of Session - 07/03/23 1320     Visit Number 2    Number of Visits 17    Date for SLP Re-Evaluation 08/23/23    Authorization Type medicaid - no auth, I am recommending PT as well and she would like to save some visits for end of year    Authorization - Number of Visits 27    SLP Start Time 1315    SLP Stop Time  1400    SLP Time Calculation (min) 45 min    Activity Tolerance Patient tolerated treatment well             Past Medical History:  Diagnosis Date   Acid reflux    Arthritis    Bipolar 1 disorder (HCC)    Depression    Hyperlipidemia    Hypertension    Post traumatic stress disorder    Psoriasis    Sleep apnea    no cpap   Substance abuse (HCC)    Past Surgical History:  Procedure Laterality Date   ABDOMINAL HYSTERECTOMY     AUGMENTATION MAMMAPLASTY Bilateral    BLADDER REPAIR     BLADDER SURGERY     BREAST ENHANCEMENT SURGERY     RECTAL PROLAPSE REPAIR     REPAIR OF RECTAL PROLAPSE     TONSILLECTOMY     Patient Active Problem List   Diagnosis Date Noted   HTN (hypertension) 12/14/2022   Arthralgia 12/14/2022   Stress reaction 12/14/2022   MDD (major depressive disorder), recurrent episode, moderate (HCC) 12/14/2022   Chronic pain of left ankle 11/25/2021   Low back pain 08/21/2019   Lipoma of shoulder 10/16/2017   Shoulder pain 08/28/2017   Calcific tendinitis of shoulder 08/28/2017   Psoriasis with arthropathy (HCC) 08/15/2017   Calcific tendinitis of left shoulder 08/08/2017   Hypokalemia- mild 01/03/2016   Subclinical hypothyroidism 01/03/2016   History of tobacco use disorder- quit 2/17 12/06/2015   Status post hysterectomy 12/06/2015   Bipolar I disorder, most recent episode mixed (HCC) 11/30/2015    GERD (gastroesophageal reflux disease) 11/30/2015   Psoriasiform dermatitis 11/30/2015   Mixed hyperlipidemia 11/30/2015   History of breast augmentation 11/30/2015   Chronic fatigue 11/30/2015   Hypertension, benign essential, goal below 140/90 11/30/2015   H/O abnormal cervical Papanicolaou smear 11/30/2015   Memory changes 11/30/2015   Marijuana smoker, continuous 11/30/2015   Ache in joint 11/26/2015   Gen Arthritis 11/26/2015   Family history of breast cancer in first degree relative 11/26/2015   Moderate episode of recurrent major depressive disorder (HCC) 11/26/2015   Acute stress disorder 11/26/2015   Vitamin D  deficiency 11/26/2015    ONSET DATE: 05/30/2023   REFERRING DIAG: postconcussive syndrome  THERAPY DIAG:  Cognitive communication deficit  Rationale for Evaluation and Treatment: Rehabilitation  SUBJECTIVE:   SUBJECTIVE STATEMENT: "I got here an hour early" Pt accompanied by: significant other "Rebecca Espinoza"  PERTINENT HISTORY: Rebecca Espinoza sustained concussion June 2021 with laceration. She reports attention and memory impairment since this time. She also reports difficulty with balance since this concussion. She has not received ST or PT prior to today for concussion  PAIN:  Are you having pain? No  FALLS: Has patient fallen in last 6 months?  Comment: near misses - endorses balance difficulties.  Instructed them to call PCP and request PT evaluation - I will request this as well.   LIVING ENVIRONMENT: Lives with: lives with their spouse Lives in: House/apartment  PLOF:  Level of assistance: Independent with ADLs Employment: Retired  PATIENT GOALS: "To improve my memory"  OBJECTIVE:  Note: Objective measures were completed at Evaluation unless otherwise noted.  DIAGNOSTIC FINDINGS:   COGNITION: Overall cognitive status: Impaired Areas of impairment:  Attention: Impaired: Alternating, Divided Memory: Impaired: Working Theme park manager  function: Impaired: Self-correction and Slow processing Functional deficits:   COGNITIVE COMMUNICATION: Following directions: Follows one step commands consistently  Auditory comprehension: WFL Verbal expression: Impaired: Rebecca Espinoza reports word finding difficulty since concussion Functional communication: WFL  ORAL MOTOR EXAMINATION: Overall status: WFL Comments:   STANDARDIZED ASSESSMENTS:   Cognitive Linguistic Quick Test: AGE - 18 - 69   The Cognitive Linguistic Quick Test (CLQT) was administered to assess the relative status of five cognitive domains: attention, memory, language, executive functioning, and visuospatial skills. Scores from 10 tasks were used to estimate severity ratings (standardized for age groups 18-69 years and 70-89 years) for each domain, a clock drawing task, as well as an overall composite severity rating of cognition.        Cognitive Domain Composite Score Severity Rating  Attention 200/215 WNL  Memory 141/185 Mild  Executive Function 29/40 WNL  Language 28/37 Mild  Visuospatial Skills 96/105 WNL  Clock Drawing  12/13 WNL  Composite Severity Rating  Mild          Initiated CLQT - to be completed next session  PATIENT REPORTED OUTCOME MEASURES (PROM): Cognitive Function: She scored a 62/140 - Rebecca Espinoza rated a "1" or cannot do or very often/several times a day - recalling errands, words on the tip of her tongue, having to read something several times to understand it, remembering whether she did something she was supposed to do (take meds, feed animals), recalling new information, walking into a room and forgetting why, thinking clearly, working hard to pay attention or she will make a mistake, trouble concentrating                                                                                                                            TREATMENT DATE:   07/03/23: Completed CLQT- See above - targeted recalling when she has showers and feeds the  animals. Generated weekly shower log to check off on which day she has showered. Generated weekly animal feeding log to check off each day after she feeds the animals. Rebecca Espinoza designates the feeding check off list to be placed by her chair and her shower log to be placed on the mirror in the bathroom. Generated strategy of using multiple timers as needed for as many items that she is cooking (oven, phone, microwave or egg timers) and to not leave the kitchen when water is running or stove/oven is on.   06/28/23: Eval only - need to complete CLQT  next session    PATIENT EDUCATION: Education details: Areas of impairment, compensatory strategies for cognitive impairments Person educated: Patient and Spouse Education method: Explanation, Verbal cues, and Handouts Education comprehension: verbal cues required and needs further education   GOALS: Goals reviewed with patient? Yes  SHORT TERM GOALS: Target date: 07/26/23   Pt will use external aids to recall shower, feeding animals and medications with occasional min A from sposue Baseline: not recalling these tasks consistently Goal status: ONGOING  2.  Pt will carry over 2 compensatory strategies for attention and safety in the kitchen Baseline: not using strategies Goal status: ONGOING  3.  Pt will use compensatory strategies to report misplacing items (keys, ID, phone etc) 2 or less times a week Baseline: losing items 3-4x a week Goal status: ONGOING  4.  Pt and spouse will carryover 2 strategies to support attention and recall in verbally presented information Baseline: forgetting verbal information Goal status: ONGOING  LONG TERM GOALS: Target date: 08/23/23  Pt will report no missed meds, animal feeds and recall shower and other ADL's  Baseline: forgetting thses Goal status: ONGOING  2.  Pt will carryover 3 compensatory strategies for attention and memory when working outside or with tools for safety with rare min A Baseline: no  strategies Goal status: ONGOING  3.  Pt will carryover 3 strategies for slow processing, and to recall verbal information with rare min A Baseline:  Goal status: ONGOING  4.  Pt will improve score on PROM - Cognitive function Baseline: 62 Goal status: ONGOING    ASSESSMENT:  CLINICAL IMPRESSION: Patient is a 63  y.o. female who was seen today for cognitive impairments from concussion June 2021. She reports impaired memory and attention since concussion. Lynesha reports she forgets if she has showered, she has left the shower without rinsing conditioner from her hair, she has left the water on in the bath and flooded the bathroom. She also reports significant difficulty reading and following the instructions for her colonoscopy resulting in her calling poison control because she mixed the prep incorrectly. She leaves the stove on and forgets if she has fed her chickens and cat. She has lost her ID, the fob for the car, and reports frequently misplacing items.  Her spouse and she report she has frequent accidents, including drilling her hand when working. She states she also gets confused driving, missing turns. I recommend skilled ST to maximize cognition for safety, independence and to reduce caregiver burden. I also recommend PT evaluation for balance. She has neuro consult in June, recommend neuropsych eval as well.    OBJECTIVE IMPAIRMENTS: include attention, memory, awareness, executive functioning, and expressive language. These impairments are limiting patient from managing medications, managing appointments, managing finances, household responsibilities, and ADLs/IADLs. Factors affecting potential to achieve goals and functional outcome are severity of impairments.. Patient will benefit from skilled SLP services to address above impairments and improve overall function.  REHAB POTENTIAL: Fair length of time post onset  PLAN:  SLP FREQUENCY: 2x/week  SLP DURATION: 8 weeks  PLANNED  INTERVENTIONS: Language facilitation, Cueing hierachy, Cognitive reorganization, Internal/external aids, Functional tasks, Multimodal communication approach, SLP instruction and feedback, Compensatory strategies, Patient/family education, and 16[address] Treatment of speech (30 or 45 min)     Char Feltman, Dareen Ebbing, CCC-SLP 07/03/2023, 2:00 PM

## 2023-07-04 DIAGNOSIS — R2689 Other abnormalities of gait and mobility: Secondary | ICD-10-CM | POA: Diagnosis not present

## 2023-07-04 DIAGNOSIS — R410 Disorientation, unspecified: Secondary | ICD-10-CM | POA: Diagnosis not present

## 2023-07-04 DIAGNOSIS — Z711 Person with feared health complaint in whom no diagnosis is made: Secondary | ICD-10-CM | POA: Diagnosis not present

## 2023-07-04 DIAGNOSIS — R4789 Other speech disturbances: Secondary | ICD-10-CM | POA: Diagnosis not present

## 2023-07-04 DIAGNOSIS — F39 Unspecified mood [affective] disorder: Secondary | ICD-10-CM | POA: Diagnosis not present

## 2023-07-04 DIAGNOSIS — F4310 Post-traumatic stress disorder, unspecified: Secondary | ICD-10-CM | POA: Diagnosis not present

## 2023-07-04 DIAGNOSIS — Z87820 Personal history of traumatic brain injury: Secondary | ICD-10-CM | POA: Diagnosis not present

## 2023-07-04 DIAGNOSIS — R42 Dizziness and giddiness: Secondary | ICD-10-CM | POA: Diagnosis not present

## 2023-07-04 DIAGNOSIS — G44309 Post-traumatic headache, unspecified, not intractable: Secondary | ICD-10-CM | POA: Diagnosis not present

## 2023-07-04 DIAGNOSIS — R4701 Aphasia: Secondary | ICD-10-CM | POA: Diagnosis not present

## 2023-07-13 DIAGNOSIS — E559 Vitamin D deficiency, unspecified: Secondary | ICD-10-CM | POA: Diagnosis not present

## 2023-07-13 DIAGNOSIS — J449 Chronic obstructive pulmonary disease, unspecified: Secondary | ICD-10-CM | POA: Diagnosis not present

## 2023-07-13 DIAGNOSIS — I10 Essential (primary) hypertension: Secondary | ICD-10-CM | POA: Diagnosis not present

## 2023-07-13 DIAGNOSIS — L408 Other psoriasis: Secondary | ICD-10-CM | POA: Diagnosis not present

## 2023-07-24 ENCOUNTER — Encounter: Payer: Self-pay | Admitting: Speech Pathology

## 2023-07-24 ENCOUNTER — Ambulatory Visit: Attending: Family Medicine | Admitting: Speech Pathology

## 2023-07-24 DIAGNOSIS — R41841 Cognitive communication deficit: Secondary | ICD-10-CM | POA: Diagnosis present

## 2023-07-24 NOTE — Therapy (Signed)
 OUTPATIENT SPEECH LANGUAGE PATHOLOGY TREATMENT   Patient Name: Rebecca Espinoza MRN: [id number] DOB:[birth year], 63 y.o., female Today's Date: 07/24/2023  PCP: Allene Ivan, MD REFERRING PROVIDER: Allene Ivan, MD  END OF SESSION:  End of Session - 07/24/23 1422     Visit Number 3    Number of Visits 17    Date for SLP Re-Evaluation 08/23/23    Authorization Type medicaid - no auth, I am recommending PT as well and she would like to save some visits for end of year    SLP Start Time 1445    SLP Stop Time  1530    SLP Time Calculation (min) 45 min    Activity Tolerance Patient tolerated treatment well             Past Medical History:  Diagnosis Date   Acid reflux    Arthritis    Bipolar 1 disorder (HCC)    Depression    Hyperlipidemia    Hypertension    Post traumatic stress disorder    Psoriasis    Sleep apnea    no cpap   Substance abuse (HCC)    Past Surgical History:  Procedure Laterality Date   ABDOMINAL HYSTERECTOMY     AUGMENTATION MAMMAPLASTY Bilateral    BLADDER REPAIR     BLADDER SURGERY     BREAST ENHANCEMENT SURGERY     RECTAL PROLAPSE REPAIR     REPAIR OF RECTAL PROLAPSE     TONSILLECTOMY     Patient Active Problem List   Diagnosis Date Noted   HTN (hypertension) 12/14/2022   Arthralgia 12/14/2022   Stress reaction 12/14/2022   MDD (major depressive disorder), recurrent episode, moderate (HCC) 12/14/2022   Chronic pain of left ankle 11/25/2021   Low back pain 08/21/2019   Lipoma of shoulder 10/16/2017   Shoulder pain 08/28/2017   Calcific tendinitis of shoulder 08/28/2017   Psoriasis with arthropathy (HCC) 08/15/2017   Calcific tendinitis of left shoulder 08/08/2017   Hypokalemia- mild 01/03/2016   Subclinical hypothyroidism 01/03/2016   History of tobacco use disorder- quit 2/17 12/06/2015   Status post hysterectomy 12/06/2015   Bipolar I disorder, most recent episode mixed (HCC) 11/30/2015   GERD (gastroesophageal reflux disease)  11/30/2015   Psoriasiform dermatitis 11/30/2015   Mixed hyperlipidemia 11/30/2015   History of breast augmentation 11/30/2015   Chronic fatigue 11/30/2015   Hypertension, benign essential, goal below 140/90 11/30/2015   H/O abnormal cervical Papanicolaou smear 11/30/2015   Memory changes 11/30/2015   Marijuana smoker, continuous 11/30/2015   Ache in joint 11/26/2015   Gen Arthritis 11/26/2015   Family history of breast cancer in first degree relative 11/26/2015   Moderate episode of recurrent major depressive disorder (HCC) 11/26/2015   Acute stress disorder 11/26/2015   Vitamin D  deficiency 11/26/2015    ONSET DATE: 05/30/2023   REFERRING DIAG: postconcussive syndrome  THERAPY DIAG:  Cognitive communication deficit  Rationale for Evaluation and Treatment: Rehabilitation  SUBJECTIVE:   SUBJECTIVE STATEMENT: "I can't even tell you what he said" re: neurology visit Pt accompanied by: self   PERTINENT HISTORY: Rebecca Espinoza sustained concussion June 2021 with laceration. She reports attention and memory impairment since this time. She also reports difficulty with balance since this concussion. She has not received ST or PT prior to today for concussion  PAIN:  Are you having pain? No  FALLS: Has patient fallen in last 6 months?  Comment: near misses - endorses balance difficulties. Instructed them to call PCP  and request PT evaluation - I will request this as well.   LIVING ENVIRONMENT: Lives with: lives with their spouse Lives in: House/apartment  PLOF:  Level of assistance: Independent with ADLs Employment: Retired  PATIENT GOALS: "To improve my memory"  OBJECTIVE:  Note: Objective measures were completed at Evaluation unless otherwise noted.  DIAGNOSTIC FINDINGS:   COGNITION: Overall cognitive status: Impaired Areas of impairment:  Attention: Impaired: Alternating, Divided Memory: Impaired: Working Theme park manager function: Impaired:  Self-correction and Slow processing Functional deficits:   COGNITIVE COMMUNICATION: Following directions: Follows one step commands consistently  Auditory comprehension: WFL Verbal expression: Impaired: Rebecca Espinoza reports word finding difficulty since concussion Functional communication: WFL  ORAL MOTOR EXAMINATION: Overall status: WFL Comments:   STANDARDIZED ASSESSMENTS:   Cognitive Linguistic Quick Test: AGE - 18 - 69   The Cognitive Linguistic Quick Test (CLQT) was administered to assess the relative status of five cognitive domains: attention, memory, language, executive functioning, and visuospatial skills. Scores from 10 tasks were used to estimate severity ratings (standardized for age groups 18-69 years and 70-89 years) for each domain, a clock drawing task, as well as an overall composite severity rating of cognition.        Cognitive Domain Composite Score Severity Rating  Attention 200/215 WNL  Memory 141/185 Mild  Executive Function 29/40 WNL  Language 28/37 Mild  Visuospatial Skills 96/105 WNL  Clock Drawing  12/13 WNL  Composite Severity Rating  Mild          Initiated CLQT - to be completed next session  PATIENT REPORTED OUTCOME MEASURES (PROM): Cognitive Function: She scored a 62/140 - Rebecca Espinoza rated a "1" or cannot do or very often/several times a day - recalling errands, words on the tip of her tongue, having to read something several times to understand it, remembering whether she did something she was supposed to do (take meds, feed animals), recalling new information, walking into a room and forgetting why, thinking clearly, working hard to pay attention or she will make a mistake, trouble concentrating                                                                                                                            TREATMENT DATE:   07/24/23: Rebecca Espinoza returns with animal feeding logs and showering log. She followed up with these and reported success and  felt they were helpful, however the past week she has not completed them. She agreed to re-start the logs as she found them beneficial. Generated strategies of setting priorities each week and writing down her weekly priorities to support her attention and memory. Rebecca Espinoza mostly stays around the home with limited appointments and to do's. She generated priorities of going fishing, sewing and completing some mindfulness - she is to write these in her planner. She brings in planner with appointments written in and days with appointments outlined in marker. She is successfully using planner to manage her appointments. This week she  will add priorities and consider a to -do list if needed.   07/03/23: Completed CLQT- See above - targeted recalling when she has showers and feeds the animals. Generated weekly shower log to check off on which day she has showered. Generated weekly animal feeding log to check off each day after she feeds the animals. Tkeya designates the feeding check off list to be placed by her chair and her shower log to be placed on the mirror in the bathroom. Generated strategy of using multiple timers as needed for as many items that she is cooking (oven, phone, microwave or egg timers) and to not leave the kitchen when water is running or stove/oven is on.   06/28/23: Eval only - need to complete CLQT next session    PATIENT EDUCATION: Education details: Areas of impairment, compensatory strategies for cognitive impairments Person educated: Patient and Spouse Education method: Explanation, Verbal cues, and Handouts Education comprehension: verbal cues required and needs further education   GOALS: Goals reviewed with patient? Yes  SHORT TERM GOALS: Target date: 07/26/23   Pt will use external aids to recall shower, feeding animals and medications with occasional min A from sposue Baseline: not recalling these tasks consistently Goal status: ONGOING  2.  Pt will carry over 2  compensatory strategies for attention and safety in the kitchen Baseline: not using strategies Goal status: ONGOING  3.  Pt will use compensatory strategies to report misplacing items (keys, ID, phone etc) 2 or less times a week Baseline: losing items 3-4x a week Goal status: ONGOING  4.  Pt and spouse will carryover 2 strategies to support attention and recall in verbally presented information Baseline: forgetting verbal information Goal status: ONGOING  LONG TERM GOALS: Target date: 08/23/23  Pt will report no missed meds, animal feeds and recall shower and other ADL's  Baseline: forgetting thses Goal status: ONGOING  2.  Pt will carryover 3 compensatory strategies for attention and memory when working outside or with tools for safety with rare min A Baseline: no strategies Goal status: ONGOING  3.  Pt will carryover 3 strategies for slow processing, and to recall verbal information with rare min A Baseline:  Goal status: ONGOING  4.  Pt will improve score on PROM - Cognitive function Baseline: 62 Goal status: ONGOING    ASSESSMENT:  CLINICAL IMPRESSION: Patient is a 63  y.o. female who was seen today for cognitive impairments from concussion June 2021. She reports impaired memory and attention since concussion. Shavona reports she forgets if she has showered, she has left the shower without rinsing conditioner from her hair, she has left the water on in the bath and flooded the bathroom. She also reports significant difficulty reading and following the instructions for her colonoscopy resulting in her calling poison control because she mixed the prep incorrectly. She leaves the stove on and forgets if she has fed her chickens and cat. She has lost her ID, the fob for the car, and reports frequently misplacing items.  Her spouse and she report she has frequent accidents, including drilling her hand when working. She states she also gets confused driving, missing turns. I recommend  skilled ST to maximize cognition for safety, independence and to reduce caregiver burden. I also recommend PT evaluation for balance. She has neuro consult in June, recommend neuropsych eval as well.    OBJECTIVE IMPAIRMENTS: include attention, memory, awareness, executive functioning, and expressive language. These impairments are limiting patient from managing medications, managing appointments, managing finances,  household responsibilities, and ADLs/IADLs. Factors affecting potential to achieve goals and functional outcome are severity of impairments.. Patient will benefit from skilled SLP services to address above impairments and improve overall function.  REHAB POTENTIAL: Fair length of time post onset  PLAN:  SLP FREQUENCY: 2x/week  SLP DURATION: 8 weeks  PLANNED INTERVENTIONS: Language facilitation, Cueing hierachy, Cognitive reorganization, Internal/external aids, Functional tasks, Multimodal communication approach, SLP instruction and feedback, Compensatory strategies, Patient/family education, and [address] Treatment of speech (30 or 45 min)     Coraima Tibbs, Dareen Ebbing, CCC-SLP 07/24/2023, 3:51 PM

## 2023-07-24 NOTE — Patient Instructions (Addendum)
   Write down your top 3-4 priorities that you want to accomplish each week in your planner  A priority is to go fishing and learn to sew and raking/cleaning the yard- these are good priorities  You tube tutorials are plenty for sewing - look some up and see if any interest you  There are also tutorials on up cycling clothes on you tube   Find a tutorial or two of a sewing project that may be interesting to you   Exercise would be a good priority - walking, exercise on line  Mindfulness is very important - guided meditation on you tube, mindful breathing

## 2023-07-28 ENCOUNTER — Ambulatory Visit: Admitting: Speech Pathology

## 2023-07-28 DIAGNOSIS — R41841 Cognitive communication deficit: Secondary | ICD-10-CM

## 2023-07-28 NOTE — Therapy (Signed)
 OUTPATIENT SPEECH LANGUAGE PATHOLOGY TREATMENT   Patient Name: Rebecca Espinoza MRN: [id number] DOB:[birth year], 63 y.o., female Today's Date: 07/28/2023  PCP: Allene Ivan, MD REFERRING PROVIDER: Allene Ivan, MD  END OF SESSION:  End of Session - 07/28/23 0932     Visit Number 4    Number of Visits 17    Date for SLP Re-Evaluation 08/23/23    Authorization Type medicaid - no auth, I am recommending PT as well and she would like to save some visits for end of year    Authorization - Number of Visits 27    SLP Start Time 0932    SLP Stop Time  1015    SLP Time Calculation (min) 43 min    Activity Tolerance Patient tolerated treatment well             Past Medical History:  Diagnosis Date   Acid reflux    Arthritis    Bipolar 1 disorder (HCC)    Depression    Hyperlipidemia    Hypertension    Post traumatic stress disorder    Psoriasis    Sleep apnea    no cpap   Substance abuse (HCC)    Past Surgical History:  Procedure Laterality Date   ABDOMINAL HYSTERECTOMY     AUGMENTATION MAMMAPLASTY Bilateral    BLADDER REPAIR     BLADDER SURGERY     BREAST ENHANCEMENT SURGERY     RECTAL PROLAPSE REPAIR     REPAIR OF RECTAL PROLAPSE     TONSILLECTOMY     Patient Active Problem List   Diagnosis Date Noted   HTN (hypertension) 12/14/2022   Arthralgia 12/14/2022   Stress reaction 12/14/2022   MDD (major depressive disorder), recurrent episode, moderate (HCC) 12/14/2022   Chronic pain of left ankle 11/25/2021   Low back pain 08/21/2019   Lipoma of shoulder 10/16/2017   Shoulder pain 08/28/2017   Calcific tendinitis of shoulder 08/28/2017   Psoriasis with arthropathy (HCC) 08/15/2017   Calcific tendinitis of left shoulder 08/08/2017   Hypokalemia- mild 01/03/2016   Subclinical hypothyroidism 01/03/2016   History of tobacco use disorder- quit 2/17 12/06/2015   Status post hysterectomy 12/06/2015   Bipolar I disorder, most recent episode mixed (HCC) 11/30/2015    GERD (gastroesophageal reflux disease) 11/30/2015   Psoriasiform dermatitis 11/30/2015   Mixed hyperlipidemia 11/30/2015   History of breast augmentation 11/30/2015   Chronic fatigue 11/30/2015   Hypertension, benign essential, goal below 140/90 11/30/2015   H/O abnormal cervical Papanicolaou smear 11/30/2015   Memory changes 11/30/2015   Marijuana smoker, continuous 11/30/2015   Ache in joint 11/26/2015   Gen Arthritis 11/26/2015   Family history of breast cancer in first degree relative 11/26/2015   Moderate episode of recurrent major depressive disorder (HCC) 11/26/2015   Acute stress disorder 11/26/2015   Vitamin D  deficiency 11/26/2015    ONSET DATE: 05/30/2023   REFERRING DIAG: postconcussive syndrome  THERAPY DIAG:  Cognitive communication deficit  Rationale for Evaluation and Treatment: Rehabilitation  SUBJECTIVE:   SUBJECTIVE STATEMENT: "I can't even tell you what he said" re: neurology visit Pt accompanied by: self   PERTINENT HISTORY: Rebecca Espinoza sustained concussion June 2021 with laceration. She reports attention and memory impairment since this time. She also reports difficulty with balance since this concussion. She has not received ST or PT prior to today for concussion  PAIN:  Are you having pain? No  FALLS: Has patient fallen in last 6 months?  Comment: near misses -  endorses balance difficulties. Instructed them to call PCP and request PT evaluation - I will request this as well.   LIVING ENVIRONMENT: Lives with: lives with their spouse Lives in: House/apartment  PLOF:  Level of assistance: Independent with ADLs Employment: Retired  PATIENT GOALS: "To improve my memory"  OBJECTIVE:  Note: Objective measures were completed at Evaluation unless otherwise noted.  DIAGNOSTIC FINDINGS:   COGNITION: Overall cognitive status: Impaired Areas of impairment:  Attention: Impaired: Alternating, Divided Memory: Impaired: Working Research scientist (medical) function: Impaired: Self-correction and Slow processing Functional deficits:   COGNITIVE COMMUNICATION: Following directions: Follows one step commands consistently  Auditory comprehension: WFL Verbal expression: Impaired: Rebecca Espinoza reports word finding difficulty since concussion Functional communication: WFL  ORAL MOTOR EXAMINATION: Overall status: WFL Comments:   STANDARDIZED ASSESSMENTS:   Cognitive Linguistic Quick Test: AGE - 18 - 69   The Cognitive Linguistic Quick Test (CLQT) was administered to assess the relative status of five cognitive domains: attention, memory, language, executive functioning, and visuospatial skills. Scores from 10 tasks were used to estimate severity ratings (standardized for age groups 18-69 years and 70-89 years) for each domain, a clock drawing task, as well as an overall composite severity rating of cognition.        Cognitive Domain Composite Score Severity Rating  Attention 200/215 WNL  Memory 141/185 Mild  Executive Function 29/40 WNL  Language 28/37 Mild  Visuospatial Skills 96/105 WNL  Clock Drawing  12/13 WNL  Composite Severity Rating  Mild          Initiated CLQT - to be completed next session  PATIENT REPORTED OUTCOME MEASURES (PROM): Cognitive Function: She scored a 62/140 - Rebecca Espinoza rated a "1" or cannot do or very often/several times a day - recalling errands, words on the tip of her tongue, having to read something several times to understand it, remembering whether she did something she was supposed to do (take meds, feed animals), recalling new information, walking into a room and forgetting why, thinking clearly, working hard to pay attention or she will make a mistake, trouble concentrating                                                                                                                            TREATMENT DATE:   07/28/23: Pt arrives with planner. She has made some modifications- using  daily sheets to write to-do lists, monthly view for appointments. She is also writing weekly goals at top of page to keep highlighted. SLP provided further suggestions to optimize use- specifically for planning ahead. Recommending modification of adding to-do list items on future days per when she'll be able to complete vs one long running list with usual pushing things off. SLP introduced the concept of prioritization, scheduling, and identification of tasks needing more cognitive energy using elephants and rabbits framework. (See handout). Pt lists today's to-do list and is able to classify items as elephants vs rabbits with min-A from SLP.  Discussed focusing on elephants (cognitive demanding tasks which move pt towards goal) in manageable times, mitigating distractions. To conserve visits, pt requesting to cancel next week visits and return following week. She plans to work on implementing what we discussed today, will write down questions, challenges and priority for next session to A in recall.   07/24/23: Sharonna returns with animal feeding logs and showering log. She followed up with these and reported success and felt they were helpful, however the past week she has not completed them. She agreed to re-start the logs as she found them beneficial. Generated strategies of setting priorities each week and writing down her weekly priorities to support her attention and memory. Simar mostly stays around the home with limited appointments and to do's. She generated priorities of going fishing, sewing and completing some mindfulness - she is to write these in her planner. She brings in planner with appointments written in and days with appointments outlined in marker. She is successfully using planner to manage her appointments. This week she will add priorities and consider a to -do list if needed.   07/03/23: Completed CLQT- See above - targeted recalling when she has showers and feeds the animals. Generated  weekly shower log to check off on which day she has showered. Generated weekly animal feeding log to check off each day after she feeds the animals. Treacy designates the feeding check off list to be placed by her chair and her shower log to be placed on the mirror in the bathroom. Generated strategy of using multiple timers as needed for as many items that she is cooking (oven, phone, microwave or egg timers) and to not leave the kitchen when water is running or stove/oven is on.   06/28/23: Eval only - need to complete CLQT next session    PATIENT EDUCATION: Education details: Areas of impairment, compensatory strategies for cognitive impairments Person educated: Patient and Spouse Education method: Explanation, Verbal cues, and Handouts Education comprehension: verbal cues required and needs further education   GOALS: Goals reviewed with patient? Yes  SHORT TERM GOALS: Target date: 07/26/23   Pt will use external aids to recall shower, feeding animals and medications with occasional min A from sposue Baseline: not recalling these tasks consistently Goal status: ONGOING  2.  Pt will carry over 2 compensatory strategies for attention and safety in the kitchen Baseline: not using strategies Goal status: ONGOING  3.  Pt will use compensatory strategies to report misplacing items (keys, ID, phone etc) 2 or less times a week Baseline: losing items 3-4x a week Goal status: ONGOING  4.  Pt and spouse will carryover 2 strategies to support attention and recall in verbally presented information Baseline: forgetting verbal information Goal status: ONGOING  LONG TERM GOALS: Target date: 08/23/23  Pt will report no missed meds, animal feeds and recall shower and other ADL's  Baseline: forgetting thses Goal status: ONGOING  2.  Pt will carryover 3 compensatory strategies for attention and memory when working outside or with tools for safety with rare min A Baseline: no strategies Goal  status: ONGOING  3.  Pt will carryover 3 strategies for slow processing, and to recall verbal information with rare min A Baseline:  Goal status: ONGOING  4.  Pt will improve score on PROM - Cognitive function Baseline: 62 Goal status: ONGOING    ASSESSMENT:  CLINICAL IMPRESSION: Patient is a 64  y.o. female who was seen today for cognitive impairments from concussion June 2021. She  reports impaired memory and attention since concussion. Ilda reports she forgets if she has showered, she has left the shower without rinsing conditioner from her hair, she has left the water on in the bath and flooded the bathroom. She also reports significant difficulty reading and following the instructions for her colonoscopy resulting in her calling poison control because she mixed the prep incorrectly. She leaves the stove on and forgets if she has fed her chickens and cat. She has lost her ID, the fob for the car, and reports frequently misplacing items.  Her spouse and she report she has frequent accidents, including drilling her hand when working. She states she also gets confused driving, missing turns. I recommend skilled ST to maximize cognition for safety, independence and to reduce caregiver burden. I also recommend PT evaluation for balance. She has neuro consult in June, recommend neuropsych eval as well.    OBJECTIVE IMPAIRMENTS: include attention, memory, awareness, executive functioning, and expressive language. These impairments are limiting patient from managing medications, managing appointments, managing finances, household responsibilities, and ADLs/IADLs. Factors affecting potential to achieve goals and functional outcome are severity of impairments.. Patient will benefit from skilled SLP services to address above impairments and improve overall function.  REHAB POTENTIAL: Fair length of time post onset  PLAN:  SLP FREQUENCY: 2x/week  SLP DURATION: 8 weeks  PLANNED INTERVENTIONS:  Language facilitation, Cueing hierachy, Cognitive reorganization, Internal/external aids, Functional tasks, Multimodal communication approach, SLP instruction and feedback, Compensatory strategies, Patient/family education, and 16[address] Treatment of speech (30 or 45 min)     Alston Jerry, CCC-SLP 07/28/2023, 9:32 AM

## 2023-07-29 NOTE — Patient Instructions (Signed)
 Picking what is important:  Elephants tasks that move you towards a goal require higher level thinking skills can be done in 45 minute or less  Rabbits busy work don't require higher level thinking skills  Write down everything you want to get done. Decide which tasks are elephants and which tasks are rabbits:             Pick 2 elephants to work on per day!   When working on your elephant tasks-- FOCUS on that task only!! NO MULTITASKING!   Distractions are everywhere, that is life. Try to limit as you're able.   What are your top distractions? How can you reduce those distractions?      2.    3.    4.    5.    Retrain your brain to focus on 1 important thing at a time by starting small. Pick something important, put aside distractions, and focus your attention for a set period. Use a timer to keep you on track. When the timer goes off, you can focus attention elsewhere if needed.   Adapted from [facility].

## 2023-08-01 ENCOUNTER — Encounter

## 2023-08-04 ENCOUNTER — Encounter: Admitting: Speech Pathology

## 2023-08-08 ENCOUNTER — Ambulatory Visit: Admitting: Speech Pathology

## 2023-08-08 DIAGNOSIS — R41841 Cognitive communication deficit: Secondary | ICD-10-CM | POA: Diagnosis not present

## 2023-08-08 NOTE — Therapy (Signed)
 OUTPATIENT SPEECH LANGUAGE PATHOLOGY TREATMENT   Patient Name: Rebecca Espinoza MRN: [id number] DOB:[birth year], 63 y.o., female Today's Date: 08/08/2023  PCP: Allene Ivan, MD REFERRING PROVIDER: Allene Ivan, MD  END OF SESSION:  End of Session - 08/08/23 1224     Visit Number 5    Number of Visits 17    Date for SLP Re-Evaluation 08/23/23    Authorization Type medicaid - no auth, I am recommending PT as well and she would like to save some visits for end of year    Authorization - Visit Number 5    Authorization - Number of Visits 27    SLP Start Time 1221    SLP Stop Time  1302    SLP Time Calculation (min) 41 min    Activity Tolerance Patient tolerated treatment well             Past Medical History:  Diagnosis Date   Acid reflux    Arthritis    Bipolar 1 disorder (HCC)    Depression    Hyperlipidemia    Hypertension    Post traumatic stress disorder    Psoriasis    Sleep apnea    no cpap   Substance abuse (HCC)    Past Surgical History:  Procedure Laterality Date   ABDOMINAL HYSTERECTOMY     AUGMENTATION MAMMAPLASTY Bilateral    BLADDER REPAIR     BLADDER SURGERY     BREAST ENHANCEMENT SURGERY     RECTAL PROLAPSE REPAIR     REPAIR OF RECTAL PROLAPSE     TONSILLECTOMY     Patient Active Problem List   Diagnosis Date Noted   HTN (hypertension) 12/14/2022   Arthralgia 12/14/2022   Stress reaction 12/14/2022   MDD (major depressive disorder), recurrent episode, moderate (HCC) 12/14/2022   Chronic pain of left ankle 11/25/2021   Low back pain 08/21/2019   Lipoma of shoulder 10/16/2017   Shoulder pain 08/28/2017   Calcific tendinitis of shoulder 08/28/2017   Psoriasis with arthropathy (HCC) 08/15/2017   Calcific tendinitis of left shoulder 08/08/2017   Hypokalemia- mild 01/03/2016   Subclinical hypothyroidism 01/03/2016   History of tobacco use disorder- quit 2/17 12/06/2015   Status post hysterectomy 12/06/2015   Bipolar I disorder, most  recent episode mixed (HCC) 11/30/2015   GERD (gastroesophageal reflux disease) 11/30/2015   Psoriasiform dermatitis 11/30/2015   Mixed hyperlipidemia 11/30/2015   History of breast augmentation 11/30/2015   Chronic fatigue 11/30/2015   Hypertension, benign essential, goal below 140/90 11/30/2015   H/O abnormal cervical Papanicolaou smear 11/30/2015   Memory changes 11/30/2015   Marijuana smoker, continuous 11/30/2015   Ache in joint 11/26/2015   Gen Arthritis 11/26/2015   Family history of breast cancer in first degree relative 11/26/2015   Moderate episode of recurrent major depressive disorder (HCC) 11/26/2015   Acute stress disorder 11/26/2015   Vitamin D  deficiency 11/26/2015    ONSET DATE: 05/30/2023   REFERRING DIAG: postconcussive syndrome  THERAPY DIAG:  Cognitive communication deficit  Rationale for Evaluation and Treatment: Rehabilitation  SUBJECTIVE:   SUBJECTIVE STATEMENT: "things have been improving"   PERTINENT HISTORY: Rebecca Espinoza sustained concussion June 2021 with laceration. She reports attention and memory impairment since this time. She also reports difficulty with balance since this concussion. She has not received ST or PT prior to today for concussion  PAIN:  Are you having pain? No  FALLS: Has patient fallen in last 6 months?  Comment: near misses - endorses balance  difficulties. Instructed them to call PCP and request PT evaluation - I will request this as well.   LIVING ENVIRONMENT: Lives with: lives with their spouse Lives in: House/apartment  PLOF:  Level of assistance: Independent with ADLs Employment: Retired  PATIENT GOALS: "To improve my memory"  OBJECTIVE:  Note: Objective measures were completed at Evaluation unless otherwise noted.  DIAGNOSTIC FINDINGS:   COGNITION: Overall cognitive status: Impaired Areas of impairment:  Attention: Impaired: Alternating, Divided Memory: Impaired: Working Theme park manager  function: Impaired: Self-correction and Slow processing Functional deficits:   COGNITIVE COMMUNICATION: Following directions: Follows one step commands consistently  Auditory comprehension: WFL Verbal expression: Impaired: Rebecca Espinoza reports word finding difficulty since concussion Functional communication: WFL  ORAL MOTOR EXAMINATION: Overall status: WFL Comments:   STANDARDIZED ASSESSMENTS:   Cognitive Linguistic Quick Test: AGE - 18 - 69   The Cognitive Linguistic Quick Test (CLQT) was administered to assess the relative status of five cognitive domains: attention, memory, language, executive functioning, and visuospatial skills. Scores from 10 tasks were used to estimate severity ratings (standardized for age groups 18-69 years and 70-89 years) for each domain, a clock drawing task, as well as an overall composite severity rating of cognition.        Cognitive Domain Composite Score Severity Rating  Attention 200/215 WNL  Memory 141/185 Mild  Executive Function 29/40 WNL  Language 28/37 Mild  Visuospatial Skills 96/105 WNL  Clock Drawing  12/13 WNL  Composite Severity Rating  Mild          Initiated CLQT - to be completed next session  PATIENT REPORTED OUTCOME MEASURES (PROM): Cognitive Function: She scored a 62/140 - Rebecca Espinoza rated a "1" or cannot do or very often/several times a day - recalling errands, words on the tip of her tongue, having to read something several times to understand it, remembering whether she did something she was supposed to do (take meds, feed animals), recalling new information, walking into a room and forgetting why, thinking clearly, working hard to pay attention or she will make a mistake, trouble concentrating                                                                                                                            TREATMENT DATE:   08/08/23: Pt has implemented strategies discussed last session, reporting has created habits to  support recall and implementation of schedule planning. She continues to endorse challenges in recalling to-do list items that vary from day to day. SLP A pt in generating x3 potential strateges to address: check planner mid-day, schedule items and set timers, transfer list to white board daily in visible location. Discussed that strategies are meant to help pt but ultimately it is on her to complete tasks. Discussed setting rules and habits to support follow through and being diligent. Discussed attention strategies. Pt generated list of x4 opportunities to work on increasing mindfulness and SLP A pt in generating strategies to support (  5 senses, limiting distractions, ask for help).   07/28/23: Pt arrives with planner. She has made some modifications- using daily sheets to write to-do lists, monthly view for appointments. She is also writing weekly goals at top of page to keep highlighted. SLP provided further suggestions to optimize use- specifically for planning ahead. Recommending modification of adding to-do list items on future days per when she'll be able to complete vs one long running list with usual pushing things off. SLP introduced the concept of prioritization, scheduling, and identification of tasks needing more cognitive energy using elephants and rabbits framework. (See handout). Pt lists today's to-do list and is able to classify items as elephants vs rabbits with min-A from SLP. Discussed focusing on elephants (cognitive demanding tasks which move pt towards goal) in manageable times, mitigating distractions. To conserve visits, pt requesting to cancel next week visits and return following week. She plans to work on implementing what we discussed today, will write down questions, challenges and priority for next session to A in recall.   07/24/23: Jensine returns with animal feeding logs and showering log. She followed up with these and reported success and felt they were helpful, however the past  week she has not completed them. She agreed to re-start the logs as she found them beneficial. Generated strategies of setting priorities each week and writing down her weekly priorities to support her attention and memory. Dandra mostly stays around the home with limited appointments and to do's. She generated priorities of going fishing, sewing and completing some mindfulness - she is to write these in her planner. She brings in planner with appointments written in and days with appointments outlined in marker. She is successfully using planner to manage her appointments. This week she will add priorities and consider a to -do list if needed.   07/03/23: Completed CLQT- See above - targeted recalling when she has showers and feeds the animals. Generated weekly shower log to check off on which day she has showered. Generated weekly animal feeding log to check off each day after she feeds the animals. Viveca designates the feeding check off list to be placed by her chair and her shower log to be placed on the mirror in the bathroom. Generated strategy of using multiple timers as needed for as many items that she is cooking (oven, phone, microwave or egg timers) and to not leave the kitchen when water is running or stove/oven is on.   06/28/23: Eval only - need to complete CLQT next session    PATIENT EDUCATION: Education details: Areas of impairment, compensatory strategies for cognitive impairments Person educated: Patient and Spouse Education method: Explanation, Verbal cues, and Handouts Education comprehension: verbal cues required and needs further education   GOALS: Goals reviewed with patient? Yes  SHORT TERM GOALS: Target date: 07/26/23   Pt will use external aids to recall shower, feeding animals and medications with occasional min A from sposue Baseline: not recalling these tasks consistently Goal status: ONGOING  2.  Pt will carry over 2 compensatory strategies for attention and safety  in the kitchen Baseline: not using strategies Goal status: ONGOING  3.  Pt will use compensatory strategies to report misplacing items (keys, ID, phone etc) 2 or less times a week Baseline: losing items 3-4x a week Goal status: ONGOING  4.  Pt and spouse will carryover 2 strategies to support attention and recall in verbally presented information Baseline: forgetting verbal information Goal status: ONGOING  LONG TERM GOALS: Target date:  08/23/23  Pt will report no missed meds, animal feeds and recall shower and other ADL's  Baseline: forgetting thses Goal status: ONGOING  2.  Pt will carryover 3 compensatory strategies for attention and memory when working outside or with tools for safety with rare min A Baseline: no strategies Goal status: ONGOING  3.  Pt will carryover 3 strategies for slow processing, and to recall verbal information with rare min A Baseline:  Goal status: ONGOING  4.  Pt will improve score on PROM - Cognitive function Baseline: 62 Goal status: ONGOING    ASSESSMENT:  CLINICAL IMPRESSION: Patient is a 63  y.o. female who was seen today for cognitive impairments from concussion June 2021. She reports impaired memory and attention since concussion. Jasmon reports she forgets if she has showered, she has left the shower without rinsing conditioner from her hair, she has left the water on in the bath and flooded the bathroom. She also reports significant difficulty reading and following the instructions for her colonoscopy resulting in her calling poison control because she mixed the prep incorrectly. She leaves the stove on and forgets if she has fed her chickens and cat. She has lost her ID, the fob for the car, and reports frequently misplacing items.  Her spouse and she report she has frequent accidents, including drilling her hand when working. She states she also gets confused driving, missing turns. I recommend skilled ST to maximize cognition for safety,  independence and to reduce caregiver burden. I also recommend PT evaluation for balance. She has neuro consult in June, recommend neuropsych eval as well.    OBJECTIVE IMPAIRMENTS: include attention, memory, awareness, executive functioning, and expressive language. These impairments are limiting patient from managing medications, managing appointments, managing finances, household responsibilities, and ADLs/IADLs. Factors affecting potential to achieve goals and functional outcome are severity of impairments.. Patient will benefit from skilled SLP services to address above impairments and improve overall function.  REHAB POTENTIAL: Fair length of time post onset  PLAN:  SLP FREQUENCY: 2x/week  SLP DURATION: 8 weeks  PLANNED INTERVENTIONS: Language facilitation, Cueing hierachy, Cognitive reorganization, Internal/external aids, Functional tasks, Multimodal communication approach, SLP instruction and feedback, Compensatory strategies, Patient/family education, and [address] Treatment of speech (30 or 45 min)     Alston Jerry, CCC-SLP 08/08/2023, 1:01 PM

## 2023-08-11 ENCOUNTER — Encounter: Admitting: Speech Pathology

## 2023-08-15 ENCOUNTER — Encounter: Admitting: Speech Pathology

## 2023-08-21 DIAGNOSIS — E785 Hyperlipidemia, unspecified: Secondary | ICD-10-CM | POA: Diagnosis not present

## 2023-08-21 DIAGNOSIS — G3184 Mild cognitive impairment, so stated: Secondary | ICD-10-CM | POA: Diagnosis not present

## 2023-08-21 DIAGNOSIS — F319 Bipolar disorder, unspecified: Secondary | ICD-10-CM | POA: Diagnosis not present

## 2023-08-21 DIAGNOSIS — E559 Vitamin D deficiency, unspecified: Secondary | ICD-10-CM | POA: Diagnosis not present

## 2023-08-21 DIAGNOSIS — R5383 Other fatigue: Secondary | ICD-10-CM | POA: Diagnosis not present

## 2023-08-21 DIAGNOSIS — R682 Dry mouth, unspecified: Secondary | ICD-10-CM | POA: Diagnosis not present

## 2023-08-21 DIAGNOSIS — Z0001 Encounter for general adult medical examination with abnormal findings: Secondary | ICD-10-CM | POA: Diagnosis not present

## 2023-08-21 DIAGNOSIS — H04123 Dry eye syndrome of bilateral lacrimal glands: Secondary | ICD-10-CM | POA: Diagnosis not present

## 2023-08-21 DIAGNOSIS — R7301 Impaired fasting glucose: Secondary | ICD-10-CM | POA: Diagnosis not present

## 2023-08-21 DIAGNOSIS — I10 Essential (primary) hypertension: Secondary | ICD-10-CM | POA: Diagnosis not present

## 2023-09-04 DIAGNOSIS — F39 Unspecified mood [affective] disorder: Secondary | ICD-10-CM | POA: Diagnosis not present

## 2023-09-04 DIAGNOSIS — F0781 Postconcussional syndrome: Secondary | ICD-10-CM | POA: Diagnosis not present

## 2023-09-04 DIAGNOSIS — R4789 Other speech disturbances: Secondary | ICD-10-CM | POA: Diagnosis not present

## 2023-09-04 DIAGNOSIS — R42 Dizziness and giddiness: Secondary | ICD-10-CM | POA: Diagnosis not present

## 2023-09-04 DIAGNOSIS — Z87820 Personal history of traumatic brain injury: Secondary | ICD-10-CM | POA: Diagnosis not present

## 2023-09-04 DIAGNOSIS — Z1331 Encounter for screening for depression: Secondary | ICD-10-CM | POA: Diagnosis not present

## 2023-09-04 DIAGNOSIS — R2689 Other abnormalities of gait and mobility: Secondary | ICD-10-CM | POA: Diagnosis not present

## 2023-09-05 ENCOUNTER — Other Ambulatory Visit: Payer: Self-pay | Admitting: Neurology

## 2023-09-05 DIAGNOSIS — R410 Disorientation, unspecified: Secondary | ICD-10-CM

## 2023-09-05 DIAGNOSIS — R2689 Other abnormalities of gait and mobility: Secondary | ICD-10-CM

## 2023-09-05 DIAGNOSIS — G44309 Post-traumatic headache, unspecified, not intractable: Secondary | ICD-10-CM

## 2023-09-05 DIAGNOSIS — R4701 Aphasia: Secondary | ICD-10-CM

## 2023-09-05 DIAGNOSIS — Z711 Person with feared health complaint in whom no diagnosis is made: Secondary | ICD-10-CM

## 2023-09-05 DIAGNOSIS — R4789 Other speech disturbances: Secondary | ICD-10-CM

## 2023-09-05 DIAGNOSIS — Z87820 Personal history of traumatic brain injury: Secondary | ICD-10-CM

## 2023-09-05 DIAGNOSIS — R42 Dizziness and giddiness: Secondary | ICD-10-CM

## 2023-09-07 ENCOUNTER — Encounter: Payer: Self-pay | Admitting: Neurology

## 2023-09-11 ENCOUNTER — Inpatient Hospital Stay
Admission: RE | Admit: 2023-09-11 | Discharge: 2023-09-11 | Discharge status: Home / Self Care | Source: Ambulatory Visit | Attending: Neurology | Admitting: Neurology

## 2023-09-11 DIAGNOSIS — R2689 Other abnormalities of gait and mobility: Secondary | ICD-10-CM

## 2023-09-11 DIAGNOSIS — Z711 Person with feared health complaint in whom no diagnosis is made: Secondary | ICD-10-CM

## 2023-09-11 DIAGNOSIS — R4701 Aphasia: Secondary | ICD-10-CM

## 2023-09-11 DIAGNOSIS — Z87820 Personal history of traumatic brain injury: Secondary | ICD-10-CM

## 2023-09-11 DIAGNOSIS — R42 Dizziness and giddiness: Secondary | ICD-10-CM

## 2023-09-11 DIAGNOSIS — G44309 Post-traumatic headache, unspecified, not intractable: Secondary | ICD-10-CM

## 2023-09-11 DIAGNOSIS — R4789 Other speech disturbances: Secondary | ICD-10-CM

## 2023-09-11 DIAGNOSIS — R410 Disorientation, unspecified: Secondary | ICD-10-CM

## 2023-09-13 DIAGNOSIS — G4733 Obstructive sleep apnea (adult) (pediatric): Secondary | ICD-10-CM | POA: Diagnosis not present

## 2023-09-13 DIAGNOSIS — K5904 Chronic idiopathic constipation: Secondary | ICD-10-CM | POA: Diagnosis not present

## 2023-09-13 DIAGNOSIS — G3184 Mild cognitive impairment, so stated: Secondary | ICD-10-CM | POA: Diagnosis not present

## 2023-09-13 DIAGNOSIS — I10 Essential (primary) hypertension: Secondary | ICD-10-CM | POA: Diagnosis not present

## 2023-09-25 DIAGNOSIS — D696 Thrombocytopenia, unspecified: Secondary | ICD-10-CM | POA: Diagnosis not present

## 2023-09-25 DIAGNOSIS — Z0001 Encounter for general adult medical examination with abnormal findings: Secondary | ICD-10-CM | POA: Diagnosis not present

## 2023-10-03 DIAGNOSIS — M7061 Trochanteric bursitis, right hip: Secondary | ICD-10-CM | POA: Diagnosis not present

## 2023-10-31 DIAGNOSIS — G4733 Obstructive sleep apnea (adult) (pediatric): Secondary | ICD-10-CM | POA: Diagnosis not present

## 2023-11-03 ENCOUNTER — Encounter: Payer: Self-pay | Admitting: Emergency Medicine

## 2023-11-03 ENCOUNTER — Ambulatory Visit (INDEPENDENT_AMBULATORY_CARE_PROVIDER_SITE_OTHER)

## 2023-11-03 ENCOUNTER — Ambulatory Visit (HOSPITAL_COMMUNITY): Payer: Self-pay

## 2023-11-03 ENCOUNTER — Ambulatory Visit: Admission: EM | Admit: 2023-11-03 | Discharge: 2023-11-03 | Disposition: A | Discharge status: Home / Self Care

## 2023-11-03 DIAGNOSIS — S93502A Unspecified sprain of left great toe, initial encounter: Secondary | ICD-10-CM

## 2023-11-03 DIAGNOSIS — M79672 Pain in left foot: Secondary | ICD-10-CM | POA: Diagnosis not present

## 2023-11-03 DIAGNOSIS — S99922A Unspecified injury of left foot, initial encounter: Secondary | ICD-10-CM

## 2023-11-03 DIAGNOSIS — S92415A Nondisplaced fracture of proximal phalanx of left great toe, initial encounter for closed fracture: Secondary | ICD-10-CM | POA: Diagnosis not present

## 2023-11-03 DIAGNOSIS — S93505A Unspecified sprain of left lesser toe(s), initial encounter: Secondary | ICD-10-CM | POA: Diagnosis not present

## 2023-11-03 DIAGNOSIS — M7732 Calcaneal spur, left foot: Secondary | ICD-10-CM | POA: Diagnosis not present

## 2023-11-03 DIAGNOSIS — S80212A Abrasion, left knee, initial encounter: Secondary | ICD-10-CM | POA: Diagnosis not present

## 2023-11-03 MED ORDER — ACETAMINOPHEN 325 MG PO TABS
650.0000 mg | ORAL_TABLET | Freq: Once | ORAL | Status: AC
  Administered 2023-11-03: 650 mg via ORAL

## 2023-11-03 NOTE — Discharge Instructions (Addendum)
 You were seen today for an injury to your left foot and a scrape to your left knee. Your foot exam showed bruising and swelling but no obvious fracture was seen on the initial review of your X-rays. The radiologist will provide an official interpretation, and you will be contacted if anything abnormal is noted. For now, continue to use the post-op shoe provided, apply ice several times a day, and use Tylenol  or Ibuprofen  for pain as needed. Your knee scrape is mild and does not appear infected, but you should keep the area clean and dry while monitoring for signs of infection such as redness, warmth, swelling, or drainage. Symptoms should gradually improve over the next several days. Please follow up with orthopedics if pain and swelling do not improve within a week or if you have ongoing difficulty walking. Seek emergency care right away if you experience severe pain, sudden inability to move your toes, loss of sensation, pale or cold toes, or spreading redness and fever, as these may indicate a more serious problem.

## 2023-11-03 NOTE — ED Provider Notes (Signed)
 [facility]    CSN: [id number] Arrival date & time: 11/03/23  0945      History   Chief Complaint Chief Complaint  Patient presents with   Toe Injury    HPI Rebecca Espinoza is a 63 y.o. female.   Discussed the use of AI scribe software for clinical note transcription with the patient, who gave verbal consent to proceed.   The patient presents with left foot pain and injury following a fall through her dilapidated porch yesterday. The patient reports pain primarily in the left big toe and second toe, with associated bruising and swelling on the top of the foot. The patient also mentions a scraped left knee and soreness to left buttock from the fall. Walking is very painful, and the patient was unable to bear weight on the foot last night. Since the injury, the patient has been managing symptoms with ice and Tylenol , which provided some relief. The patient used an ace bandage to cushion the foot. There is no numbness or tingling unless the area is touched.   The following portions of the patient's history were reviewed and updated as appropriate: allergies, current medications, past family history, past medical history, past social history, past surgical history, and problem list.       Past Medical History:  Diagnosis Date   Acid reflux    Arthritis    Bipolar 1 disorder (HCC)    Depression    Hyperlipidemia    Hypertension    Post traumatic stress disorder    Psoriasis    Sleep apnea    no cpap   Substance abuse (HCC)     Patient Active Problem List   Diagnosis Date Noted   HTN (hypertension) 12/14/2022   Arthralgia 12/14/2022   Stress reaction 12/14/2022   MDD (major depressive disorder), recurrent episode, moderate (HCC) 12/14/2022   Chronic pain of left ankle 11/25/2021   Arthralgia of left ankle 11/25/2021   Low back pain 08/21/2019   Lipoma of shoulder 10/16/2017   Shoulder pain 08/28/2017   Calcific tendinitis of shoulder 08/28/2017    Arthropathic psoriasis, unspecified (HCC) 08/15/2017   Calcific tendinitis of left shoulder 08/08/2017   Hypokalemia- mild 01/03/2016   Subclinical hypothyroidism 01/03/2016   History of tobacco use disorder- quit 2/17 12/06/2015   Status post hysterectomy 12/06/2015   Bipolar I disorder, most recent episode mixed (HCC) 11/30/2015   GERD (gastroesophageal reflux disease) 11/30/2015   Psoriasiform dermatitis 11/30/2015   Mixed hyperlipidemia 11/30/2015   History of breast augmentation 11/30/2015   Chronic fatigue 11/30/2015   Hypertension, benign essential, goal below 140/90 11/30/2015   H/O abnormal cervical Papanicolaou smear 11/30/2015   Memory changes 11/30/2015   Marijuana smoker, continuous 11/30/2015   Ache in joint 11/26/2015   Gen Arthritis 11/26/2015   Family history of breast cancer in first degree relative 11/26/2015   Moderate episode of recurrent major depressive disorder (HCC) 11/26/2015   Acute stress disorder 11/26/2015   Vitamin D  deficiency 11/26/2015    Past Surgical History:  Procedure Laterality Date   ABDOMINAL HYSTERECTOMY     AUGMENTATION MAMMAPLASTY Bilateral    BLADDER REPAIR     BLADDER SURGERY     BREAST ENHANCEMENT SURGERY     RECTAL PROLAPSE REPAIR     REPAIR OF RECTAL PROLAPSE     TONSILLECTOMY      OB History     Gravida  5   Para  4   Term  4   Preterm  0   AB  1   Living         SAB  1   IAB  0   Ectopic  0   Multiple      Live Births               Home Medications    Prior to Admission medications   Medication Sig Start Date End Date Taking? Authorizing Provider  atorvastatin (LIPITOR) 20 MG tablet Take 20 mg by mouth daily. 10/04/22  Yes [provider]  buPROPion (WELLBUTRIN SR) 150 MG 12 hr tablet Take 150 mg by mouth daily.  11/08/17  Yes [provider]  hydrochlorothiazide  (HYDRODIURIL ) 25 MG tablet Take 25 mg by mouth daily.   Yes [provider]  lamoTRIgine (LAMICTAL) 100 MG  tablet Take 100 mg by mouth daily.   Yes [provider]  lisinopril  (ZESTRIL ) 10 MG tablet Take 10 mg by mouth daily.   Yes [provider]  omeprazole (PRILOSEC) 20 MG capsule Take 20 mg by mouth daily.   Yes [provider]  QUEtiapine (SEROQUEL) 100 MG tablet Take 100 mg by mouth at bedtime.   Yes [provider]  sertraline (ZOLOFT) 100 MG tablet Take 100 mg by mouth daily.   Yes [provider]  amoxicillin  (AMOXIL ) 500 MG capsule TAKE 4 CAPSULES BY MOUTH NOW AND 1 EVERY 8 HOURS FOR 6 DAYS Patient not taking: Reported on 11/03/2023    [provider]  buPROPion (ZYBAN) 150 MG 12 hr tablet Take 1 tablet twice a day by oral route.    [provider]  folic acid  (FOLVITE ) 1 MG tablet 1 tablet Orally Once a day; Duration: 30 day(s) Patient not taking: Reported on 11/03/2023 09/07/17   [provider]  ibuprofen  (ADVIL ) 800 MG tablet TAKE 1 TABLET BY MOUTH EVERY 6 TO 8 HOURS AS NEEDED FOR PAIN    [provider]  LINZESS 72 MCG capsule Take 72 mcg by mouth every morning. 05/11/23   [provider]  methotrexate 50 MG/2ML injection .4 Injection once weekly; Duration: 30 days Patient not taking: Reported on 11/03/2023 09/07/17   [provider]  nortriptyline (PAMELOR) 10 MG capsule Take 10 mg nightly for one week, then increase to 20 mg nightly for post concussion syndrome. Patient not taking: Reported on 11/03/2023 07/04/23   [provider]    Family History Family History  Problem Relation Age of Onset   Breast cancer Mother    Alcohol abuse Father    Depression Father    Colon cancer Maternal Grandfather    Colon cancer Cousin    Colon polyps Neg Hx    Esophageal cancer Neg Hx    Stomach cancer Neg Hx    Rectal cancer Neg Hx     Social History Social History   Tobacco Use   Smoking status: Former    Current packs/day: 0.50    Types: Cigarettes   Smokeless tobacco: Never   Vaping Use   Vaping status: Former  Substance Use Topics   Alcohol use: Not Currently    Alcohol/week: 3.0 - 4.0 standard drinks of alcohol    Types: 3 - 4 Cans of beer per week   Drug use: Yes    Types: Marijuana    Comment: Daily, last use 06/20/23     Allergies   Fish allergy   Review of Systems Review of Systems  Musculoskeletal:  Positive for gait problem.  Left foot pain and swelling   Skin:  Positive for wound.  Neurological:  Negative for weakness and numbness.  All other systems reviewed and are negative.    Physical Exam Triage Vital Signs ED Triage Vitals  Encounter Vitals Group     BP 11/03/23 1141 (!) 143/82     Girls Systolic BP Percentile --      Girls Diastolic BP Percentile --      Boys Systolic BP Percentile --      Boys Diastolic BP Percentile --      Pulse Rate 11/03/23 1141 64     Resp 11/03/23 1141 14     Temp 11/03/23 1141 98.6 F (37 C)     Temp Source 11/03/23 1141 Oral     SpO2 11/03/23 1141 97 %     Weight --      Height --      Head Circumference --      Peak Flow --      Pain Score 11/03/23 1142 10     Pain Loc --      Pain Education --      Exclude from Growth Chart --    No data found.  Updated Vital Signs BP (!) 143/82 (BP Location: Left Arm)   Pulse 64   Temp 98.6 F (37 C) (Oral)   Resp 14   SpO2 97%   Visual Acuity Right Eye Distance:   Left Eye Distance:   Bilateral Distance:    Right Eye Near:   Left Eye Near:    Bilateral Near:     Physical Exam Vitals reviewed.  Constitutional:      General: She is awake. She is not in acute distress.    Appearance: Normal appearance. She is well-developed. She is not ill-appearing, toxic-appearing or diaphoretic.  HENT:     Head: Normocephalic.     Right Ear: Hearing normal.     Left Ear: Hearing normal.     Nose: Nose normal.     Mouth/Throat:     Mouth: Mucous membranes are moist.  Eyes:     General: Vision grossly intact.     Conjunctiva/sclera:  Conjunctivae normal.  Cardiovascular:     Rate and Rhythm: Normal rate and regular rhythm.     Pulses:          Dorsalis pedis pulses are 2+ on the left side.     Heart sounds: Normal heart sounds.  Pulmonary:     Effort: Pulmonary effort is normal.     Breath sounds: Normal breath sounds and air entry.  Musculoskeletal:        General: Normal range of motion.     Cervical back: Full passive range of motion without pain, normal range of motion and neck supple.     Left foot: Normal range of motion. No deformity.  Feet:     Left foot:     Toenail Condition: Left toenails are normal.     Comments: Bruising and swelling over the dorsal aspect of the left foot, with additional bruising noted at the posterior aspect of the great toe. Range of motion of the foot and toes is intact but with discomfort, particularly involving the great and second toes. Sensation is preserved throughout, and neurovascular status is intact (see images below)   Skin:    General: Skin is warm and dry.     Findings: Abrasion present.     Comments: Erythematous abrasion noted to anterior aspect of the  left knee without any swelling or drainage noted (see image below)  Neurological:     General: No focal deficit present.     Mental Status: She is alert and oriented to person, place, and time.  Psychiatric:        Speech: Speech normal.        Behavior: Behavior is cooperative.           UC Treatments / Results  Labs (all labs ordered are listed, but only abnormal results are displayed) Labs Reviewed - No data to display  EKG   Radiology No results found.  Procedures Procedures (including critical care time)  Medications Ordered in UC Medications  acetaminophen  (TYLENOL ) tablet 650 mg (650 mg Oral Given 11/03/23 1147)    Initial Impression / Assessment and Plan / UC Course  I have reviewed the triage vital signs and the nursing notes.  Pertinent labs & imaging results that were available  during my care of the patient were reviewed by me and considered in my medical decision making (see chart for details).    The patient presents after sustaining an injury to the left foot yesterday, with pain focused around the great and second toes. Examination shows bruising and swelling across the dorsal foot and posterior great toe, with preserved sensation, intact neurovascular status, and full but painful range of motion. Preliminary review of X-rays did not reveal obvious fracture, though the radiologist's final interpretation is pending. The patient has been using ice and Tylenol  with partial relief. A post-op shoe was provided, and the patient was instructed to continue ice and Tylenol  as needed while avoiding weight-bearing until further evaluation. The patient will be contacted if the official radiology report shows any abnormalities. The patient also sustained a left knee abrasion from the same fall, noted to be bruised and mildly swollen but nontender. Self-care measures should be continued, and monitoring for signs of infection or worsening symptoms was advised. Follow-up with the primary care provider was recommended if symptoms do not improve, and emergency care should be sought for severe pain, inability to move the toes, changes in sensation, or signs of infection. Today's evaluation has revealed no signs of a dangerous process. Discussed diagnosis with patient and/or guardian. Patient and/or guardian aware of their diagnosis, possible red flag symptoms to watch out for and need for close follow up. Patient and/or guardian understands verbal and written discharge instructions. Patient and/or guardian comfortable with plan and disposition.  Patient and/or guardian has a clear mental status at this time, good insight into illness (after discussion and teaching) and has clear judgment to make decisions regarding their care  Documentation was completed with the aid of voice recognition software.  Transcription may contain typographical errors.  Final Clinical Impressions(s) / UC Diagnoses   Final diagnoses:  Injury of left foot, initial encounter  Abrasion of left knee, initial encounter  Sprain of great toe of left foot, initial encounter  Sprain of toe, second, left, initial encounter     Discharge Instructions      You were seen today for an injury to your left foot and a scrape to your left knee. Your foot exam showed bruising and swelling but no obvious fracture was seen on the initial review of your X-rays. The radiologist will provide an official interpretation, and you will be contacted if anything abnormal is noted. For now, continue to use the post-op shoe provided, apply ice several times a day, and use Tylenol  or Ibuprofen  for pain as  needed. Your knee scrape is mild and does not appear infected, but you should keep the area clean and dry while monitoring for signs of infection such as redness, warmth, swelling, or drainage. Symptoms should gradually improve over the next several days. Please follow up with orthopedics if pain and swelling do not improve within a week or if you have ongoing difficulty walking. Seek emergency care right away if you experience severe pain, sudden inability to move your toes, loss of sensation, pale or cold toes, or spreading redness and fever, as these may indicate a more serious problem.      ED Prescriptions   None    PDMP not reviewed this encounter.   Iola Lukes, [place] 11/03/23 1314

## 2023-11-03 NOTE — ED Triage Notes (Signed)
 Pt reports fall that occurred yesterday around 5pm that resulted in toe injury/pain. L great toe and 2nd toe have constant aching pain with bruising around the area. Reduced ROM in the toe with increased pain trying to bend them. Tylenol  gave mild relief last night. Fall happened as she tried to step down from back door to step stool. Step stool moved slightly with pressure and she was thrown to the side. Thinks her toe hit an old fridge to the side of old porch.

## 2023-11-04 DIAGNOSIS — M79672 Pain in left foot: Secondary | ICD-10-CM | POA: Diagnosis not present

## 2023-11-06 DIAGNOSIS — Z711 Person with feared health complaint in whom no diagnosis is made: Secondary | ICD-10-CM | POA: Diagnosis not present

## 2023-11-06 DIAGNOSIS — F39 Unspecified mood [affective] disorder: Secondary | ICD-10-CM | POA: Diagnosis not present

## 2023-11-06 DIAGNOSIS — R519 Headache, unspecified: Secondary | ICD-10-CM | POA: Diagnosis not present

## 2023-11-06 DIAGNOSIS — R2689 Other abnormalities of gait and mobility: Secondary | ICD-10-CM | POA: Diagnosis not present

## 2023-11-06 DIAGNOSIS — F0781 Postconcussional syndrome: Secondary | ICD-10-CM | POA: Diagnosis not present

## 2023-11-06 DIAGNOSIS — F4310 Post-traumatic stress disorder, unspecified: Secondary | ICD-10-CM | POA: Diagnosis not present

## 2023-11-06 DIAGNOSIS — Z87820 Personal history of traumatic brain injury: Secondary | ICD-10-CM | POA: Diagnosis not present

## 2023-11-06 NOTE — Progress Notes (Signed)
 X-ray revealed a small, nondisplaced fracture of the left great toe extending into the joint. As the bone remains well aligned, surgery is not anticipated. Patient should elevate the foot as much as possible, apply ice several times daily, and continue use of the post-op shoe. Over-the-counter NSAIDs may be taken as needed for pain. Recommended follow-up with orthopedics for ongoing management.

## 2023-11-09 DIAGNOSIS — M79672 Pain in left foot: Secondary | ICD-10-CM | POA: Diagnosis not present

## 2023-11-09 DIAGNOSIS — S92492D Other fracture of left great toe, subsequent encounter for fracture with routine healing: Secondary | ICD-10-CM | POA: Diagnosis not present

## 2023-11-09 DIAGNOSIS — G4733 Obstructive sleep apnea (adult) (pediatric): Secondary | ICD-10-CM | POA: Diagnosis not present

## 2023-11-09 DIAGNOSIS — R591 Generalized enlarged lymph nodes: Secondary | ICD-10-CM | POA: Diagnosis not present

## 2023-11-21 NOTE — Telephone Encounter (Signed)
 Pt's chart was accessed d/t pt calling to follow up on VM left by Loa, RN on 8/25.

## 2023-11-24 DIAGNOSIS — S92415D Nondisplaced fracture of proximal phalanx of left great toe, subsequent encounter for fracture with routine healing: Secondary | ICD-10-CM | POA: Diagnosis not present

## 2023-12-25 DIAGNOSIS — S92425D Nondisplaced fracture of distal phalanx of left great toe, subsequent encounter for fracture with routine healing: Secondary | ICD-10-CM | POA: Diagnosis not present

## 2024-01-30 DIAGNOSIS — R591 Generalized enlarged lymph nodes: Secondary | ICD-10-CM | POA: Diagnosis not present

## 2024-01-30 DIAGNOSIS — D696 Thrombocytopenia, unspecified: Secondary | ICD-10-CM | POA: Diagnosis not present

## 2024-01-30 DIAGNOSIS — Z23 Encounter for immunization: Secondary | ICD-10-CM | POA: Diagnosis not present

## 2024-01-30 DIAGNOSIS — K5909 Other constipation: Secondary | ICD-10-CM | POA: Diagnosis not present

## 2024-01-30 DIAGNOSIS — Z0001 Encounter for general adult medical examination with abnormal findings: Secondary | ICD-10-CM | POA: Diagnosis not present

## 2024-01-30 DIAGNOSIS — E785 Hyperlipidemia, unspecified: Secondary | ICD-10-CM | POA: Diagnosis not present

## 2024-01-30 DIAGNOSIS — F319 Bipolar disorder, unspecified: Secondary | ICD-10-CM | POA: Diagnosis not present

## 2024-01-30 DIAGNOSIS — I10 Essential (primary) hypertension: Secondary | ICD-10-CM | POA: Diagnosis not present

## 2024-01-30 DIAGNOSIS — E559 Vitamin D deficiency, unspecified: Secondary | ICD-10-CM | POA: Diagnosis not present
